# Patient Record
Sex: Female | Born: 1950 | Race: White | Hispanic: No | Marital: Married | Smoking: Never smoker
Health system: Southern US, Community
[De-identification: ages and names within clinical notes are randomized; demographics above are authoritative.]

## PROBLEM LIST (undated history)

## (undated) ENCOUNTER — Ambulatory Visit: Admission: EM | Payer: Medicare PPO

## (undated) HISTORY — PX: GALLBLADDER SURGERY: SHX652

## (undated) HISTORY — PX: COLONOSCOPY: SHX174

## (undated) HISTORY — PX: CERVICAL SPINE SURGERY: SHX589

## (undated) HISTORY — PX: CHOLECYSTECTOMY: SHX55

---

## 1998-03-22 ENCOUNTER — Other Ambulatory Visit: Admission: RE | Admit: 1998-03-22 | Discharge: 1998-03-22 | Payer: Self-pay | Admitting: Obstetrics & Gynecology

## 1999-05-07 ENCOUNTER — Other Ambulatory Visit: Admission: RE | Admit: 1999-05-07 | Discharge: 1999-05-07 | Payer: Self-pay | Admitting: Obstetrics & Gynecology

## 2000-08-25 ENCOUNTER — Other Ambulatory Visit: Admission: RE | Admit: 2000-08-25 | Discharge: 2000-08-25 | Payer: Self-pay | Admitting: Obstetrics & Gynecology

## 2002-02-23 ENCOUNTER — Other Ambulatory Visit: Admission: RE | Admit: 2002-02-23 | Discharge: 2002-02-23 | Payer: Self-pay | Admitting: Obstetrics & Gynecology

## 2003-12-15 ENCOUNTER — Emergency Department (HOSPITAL_COMMUNITY): Admission: EM | Admit: 2003-12-15 | Discharge: 2003-12-15 | Payer: Self-pay | Admitting: Emergency Medicine

## 2005-02-17 ENCOUNTER — Other Ambulatory Visit: Admission: RE | Admit: 2005-02-17 | Discharge: 2005-02-17 | Payer: Self-pay | Admitting: Obstetrics & Gynecology

## 2009-04-19 ENCOUNTER — Ambulatory Visit (HOSPITAL_COMMUNITY): Admission: RE | Admit: 2009-04-19 | Discharge: 2009-04-20 | Payer: Self-pay | Admitting: Neurological Surgery

## 2009-05-22 ENCOUNTER — Encounter: Admission: RE | Admit: 2009-05-22 | Discharge: 2009-05-22 | Payer: Self-pay | Admitting: Neurological Surgery

## 2009-07-16 ENCOUNTER — Encounter: Admission: RE | Admit: 2009-07-16 | Discharge: 2009-07-16 | Payer: Self-pay | Admitting: Neurological Surgery

## 2009-10-15 ENCOUNTER — Encounter: Admission: RE | Admit: 2009-10-15 | Discharge: 2009-10-15 | Payer: Self-pay | Admitting: Neurological Surgery

## 2010-06-02 IMAGING — CR DG CERVICAL SPINE 1V
1 series · 1 of 1 positions shown · non-contrast
Comparison: 04/19/2009

CLINICAL DATA: Postop pain.

CERVICAL SPINE - 1 VIEW

[view not recorded]
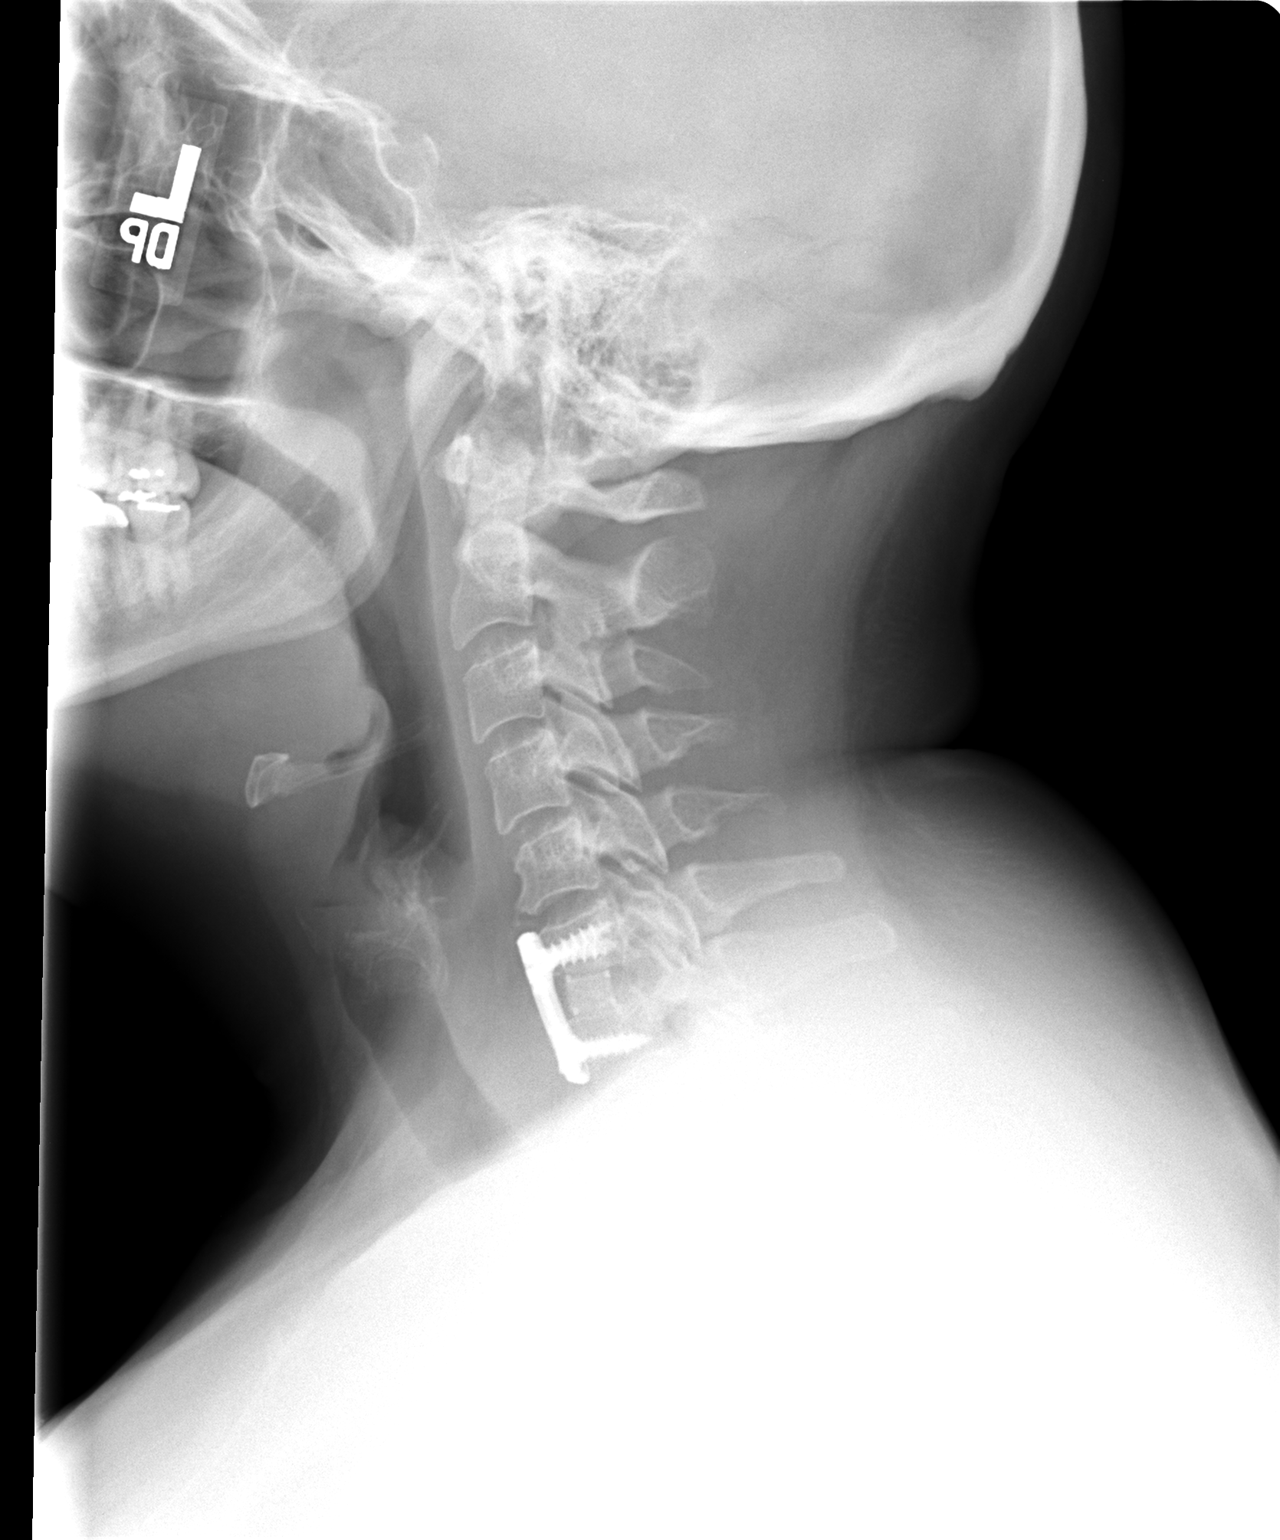

[1 of 1 positions shown; findings below may reference images not displayed]

FINDINGS: Changes of ACDF noted as C6-7.  Mild degenerative changes
as C5-6.  No prevertebral soft tissue swelling.  Normal alignment.
No complicating feature.
IMPRESSION: ACDF C6-7 without complicating feature.

## 2010-06-17 LAB — BASIC METABOLIC PANEL
BUN: 12 mg/dL (ref 6–23)
CO2: 29 mEq/L (ref 19–32)
Calcium: 9.6 mg/dL (ref 8.4–10.5)
Creatinine, Ser: 0.7 mg/dL (ref 0.4–1.2)
GFR calc non Af Amer: >60 mL/min (ref >60)
Potassium: 3.7 mEq/L (ref 3.5–5.1)

## 2010-06-17 LAB — DIFFERENTIAL
Basophils Absolute: 0 10*3/uL (ref 0.0–0.1)
Basophils Relative: 1 % (ref 0–1)
Eosinophils Absolute: 0.1 10*3/uL (ref 0.0–0.7)
Eosinophils Relative: 2 % (ref 0–5)
Lymphocytes Relative: 29 % (ref 12–46)
Lymphs Abs: 2.3 10*3/uL (ref 0.7–4.0)
Monocytes Relative: 8 % (ref 3–12)

## 2010-06-17 LAB — CBC
Hemoglobin: 14.3 g/dL (ref 12.0–15.0)
MCHC: 34.4 g/dL (ref 30.0–36.0)

## 2010-06-17 LAB — PROTIME-INR: Prothrombin Time: 12.8 seconds (ref 11.6–15.2)

## 2011-12-30 ENCOUNTER — Other Ambulatory Visit: Payer: Self-pay | Admitting: Obstetrics & Gynecology

## 2011-12-30 DIAGNOSIS — R928 Other abnormal and inconclusive findings on diagnostic imaging of breast: Secondary | ICD-10-CM

## 2012-01-01 ENCOUNTER — Other Ambulatory Visit: Payer: Self-pay | Admitting: Obstetrics & Gynecology

## 2012-01-01 ENCOUNTER — Ambulatory Visit
Admission: RE | Admit: 2012-01-01 | Discharge: 2012-01-01 | Disposition: A | Discharge status: Home / Self Care | Payer: 59 | Source: Ambulatory Visit | Attending: Obstetrics & Gynecology | Admitting: Obstetrics & Gynecology

## 2012-01-01 DIAGNOSIS — R928 Other abnormal and inconclusive findings on diagnostic imaging of breast: Secondary | ICD-10-CM

## 2012-01-01 DIAGNOSIS — N6310 Unspecified lump in the right breast, unspecified quadrant: Secondary | ICD-10-CM

## 2012-01-07 ENCOUNTER — Ambulatory Visit
Admission: RE | Admit: 2012-01-07 | Discharge: 2012-01-07 | Disposition: A | Discharge status: Home / Self Care | Payer: 59 | Source: Ambulatory Visit | Attending: Obstetrics & Gynecology | Admitting: Obstetrics & Gynecology

## 2012-01-07 ENCOUNTER — Other Ambulatory Visit: Payer: Self-pay | Admitting: Obstetrics & Gynecology

## 2012-01-07 DIAGNOSIS — N6310 Unspecified lump in the right breast, unspecified quadrant: Secondary | ICD-10-CM

## 2012-01-08 ENCOUNTER — Ambulatory Visit
Admission: RE | Admit: 2012-01-08 | Discharge: 2012-01-08 | Disposition: A | Discharge status: Home / Self Care | Payer: 59 | Source: Ambulatory Visit | Attending: Obstetrics & Gynecology | Admitting: Obstetrics & Gynecology

## 2012-01-08 DIAGNOSIS — N6310 Unspecified lump in the right breast, unspecified quadrant: Secondary | ICD-10-CM

## 2012-12-30 ENCOUNTER — Other Ambulatory Visit: Payer: Self-pay

## 2013-12-13 ENCOUNTER — Other Ambulatory Visit: Payer: Self-pay

## 2013-12-13 DIAGNOSIS — Z1231 Encounter for screening mammogram for malignant neoplasm of breast: Secondary | ICD-10-CM

## 2014-01-09 ENCOUNTER — Ambulatory Visit
Admission: RE | Admit: 2014-01-09 | Discharge: 2014-01-09 | Disposition: A | Discharge status: Home / Self Care | Payer: BC Managed Care – PPO | Source: Ambulatory Visit

## 2014-01-09 DIAGNOSIS — Z1231 Encounter for screening mammogram for malignant neoplasm of breast: Secondary | ICD-10-CM

## 2014-01-19 ENCOUNTER — Ambulatory Visit (INDEPENDENT_AMBULATORY_CARE_PROVIDER_SITE_OTHER): Payer: BC Managed Care – PPO | Admitting: Podiatry

## 2014-01-19 ENCOUNTER — Ambulatory Visit (INDEPENDENT_AMBULATORY_CARE_PROVIDER_SITE_OTHER): Payer: BC Managed Care – PPO

## 2014-01-19 ENCOUNTER — Encounter: Payer: Self-pay | Admitting: Podiatry

## 2014-01-19 VITALS — BP 134/77 | HR 75 | Resp 16 | Ht 61.0 in | Wt 183.0 lb

## 2014-01-19 DIAGNOSIS — M722 Plantar fascial fibromatosis: Secondary | ICD-10-CM

## 2014-01-19 MED ORDER — MELOXICAM 15 MG PO TABS: 15.0000 mg | ORAL_TABLET | Freq: Every day | ORAL | Status: DC

## 2014-01-19 MED ORDER — METHYLPREDNISOLONE (PAK) 4 MG PO TABS: ORAL_TABLET | ORAL | Status: DC

## 2014-01-19 NOTE — Progress Notes (Signed)
   Subjective:    Patient ID: Kathy [name], female    DOB: December 04, 1950, 63 y.o.   MRN: [id number]  HPI Comments: Both feet having pain in the arches, the left started first but that has got better, the right arch has got worse , it hurts when walking and if mash on it feels like a deep tissue bruise  Foot Pain      Review of Systems  All other systems reviewed and are negative.      Objective:   Physical Exam: I have reviewed her past medical history medications allergies surgeries social history and family history. Pulses are strongly palpable bilateral. Neurologic sensorium is intact per Semmes-Weinstein monofilament. Deep tendon reflexes are intact bilateral muscle strength is 5 over 5 dorsiflexors plantar flexors inverters everters all intrinsic musculature is intact. Orthopedic evaluation demonstrates all joints distal to the ankle a full range of motion without crepitus that she has pain on palpation of the medial calcaneal tubercles bilateral right greater than left. Radiographic evaluation demonstrates plantar distally oriented calcaneal heel spurs well soft tissue increase in density at the plantar fascial calcaneal insertion sites bilateral. Cutaneous evaluation demonstrates supple well hydrated cutis no erythema edema cellulitis drainage or odor.        Assessment & Plan:  Assessment: Plantar fasciitis bilateral.  Plan: Discussed etiology pathology conservative versus surgical therapies. Started her on a Medrol Dosepak to be followed by meloxicam. Injected the bilateral heels today with Kenalog and local anesthetic. She was dispensed plantar fascial brace is as well as a night splint. We discussed appropriate shoe gear stretching exercises ice therapy shoe gear modifications. I will followup with her in one month we discussed appropriate shoe gear stretching exercises ice therapy shoe gear modifications.

## 2014-02-16 ENCOUNTER — Ambulatory Visit (INDEPENDENT_AMBULATORY_CARE_PROVIDER_SITE_OTHER): Payer: BC Managed Care – PPO | Admitting: Podiatry

## 2014-02-16 VITALS — BP 150/67 | HR 72 | Resp 16

## 2014-02-16 DIAGNOSIS — M722 Plantar fascial fibromatosis: Secondary | ICD-10-CM

## 2014-02-16 NOTE — Progress Notes (Signed)
She presents today for follow-up of bilateral plantar fasciitis. She states that finger doing okay and having been on them a lot.  Objective: Vital signs are stable she is alert and oriented 3. Pulses are strongly palpable bilateral. She has pain on palpation medial calcaneal tubercles bilateral.  Assessment: Plantar fasciitis bilateral.  Plan: Reinjected her bilateral heels today with Kenalog and local anesthetic. She will continue all conservative therapies will follow up with her in 1 month for possible set of orthotics if needed.

## 2014-03-16 ENCOUNTER — Encounter: Payer: Self-pay | Admitting: Podiatry

## 2014-03-16 ENCOUNTER — Ambulatory Visit (INDEPENDENT_AMBULATORY_CARE_PROVIDER_SITE_OTHER): Payer: BC Managed Care – PPO | Admitting: Podiatry

## 2014-03-16 VITALS — BP 130/70 | HR 77 | Resp 16

## 2014-03-16 DIAGNOSIS — M722 Plantar fascial fibromatosis: Secondary | ICD-10-CM

## 2014-03-16 NOTE — Progress Notes (Signed)
She presents today for follow-up of plantar fasciitis. Ace that it comes and goes but it is not bad as it was.  Objective: Vital signs are stable she is alert and oriented 3 pulses are palpable bilateral. No Pain. She has pain on palpation medial calcaneal tubercles bilateral.  Assessment: Plantar fasciitis bilateral resolving.  Plan: She was candidate for set of orthotics.

## 2014-04-05 ENCOUNTER — Ambulatory Visit: Payer: BLUE CROSS/BLUE SHIELD | Admitting: *Deleted

## 2014-04-05 DIAGNOSIS — M722 Plantar fascial fibromatosis: Secondary | ICD-10-CM

## 2014-04-05 NOTE — Patient Instructions (Signed)

## 2014-04-05 NOTE — Progress Notes (Signed)
PATIENT PRESENTS FOR ORTHOTIC PICK UP  

## 2014-05-05 ENCOUNTER — Encounter: Payer: Self-pay | Admitting: Podiatry

## 2017-11-29 DIAGNOSIS — F419 Anxiety disorder, unspecified: Secondary | ICD-10-CM | POA: Insufficient documentation

## 2018-01-15 ENCOUNTER — Other Ambulatory Visit: Payer: Self-pay | Admitting: Family Medicine

## 2018-01-15 DIAGNOSIS — Z1231 Encounter for screening mammogram for malignant neoplasm of breast: Secondary | ICD-10-CM

## 2018-01-15 DIAGNOSIS — M8580 Other specified disorders of bone density and structure, unspecified site: Secondary | ICD-10-CM

## 2018-02-18 ENCOUNTER — Ambulatory Visit
Admission: RE | Admit: 2018-02-18 | Discharge: 2018-02-18 | Disposition: A | Discharge status: Home / Self Care | Payer: Medicare Other | Source: Ambulatory Visit | Attending: Family Medicine | Admitting: Family Medicine

## 2018-02-18 DIAGNOSIS — Z1231 Encounter for screening mammogram for malignant neoplasm of breast: Secondary | ICD-10-CM

## 2018-03-09 ENCOUNTER — Ambulatory Visit
Admission: RE | Admit: 2018-03-09 | Discharge: 2018-03-09 | Disposition: A | Discharge status: Home / Self Care | Payer: Medicare Other | Source: Ambulatory Visit | Attending: Family Medicine | Admitting: Family Medicine

## 2018-03-09 DIAGNOSIS — M8580 Other specified disorders of bone density and structure, unspecified site: Secondary | ICD-10-CM

## 2018-03-23 ENCOUNTER — Other Ambulatory Visit: Payer: Self-pay

## 2019-02-01 ENCOUNTER — Other Ambulatory Visit: Payer: Self-pay | Admitting: Family Medicine

## 2019-02-01 DIAGNOSIS — Z1231 Encounter for screening mammogram for malignant neoplasm of breast: Secondary | ICD-10-CM

## 2019-03-23 ENCOUNTER — Ambulatory Visit
Admission: RE | Admit: 2019-03-23 | Discharge: 2019-03-23 | Disposition: A | Discharge status: Home / Self Care | Payer: Medicare Other | Source: Ambulatory Visit | Attending: Family Medicine | Admitting: Family Medicine

## 2019-03-23 ENCOUNTER — Other Ambulatory Visit: Payer: Self-pay

## 2019-03-23 DIAGNOSIS — Z1231 Encounter for screening mammogram for malignant neoplasm of breast: Secondary | ICD-10-CM

## 2019-04-28 ENCOUNTER — Ambulatory Visit: Payer: Self-pay

## 2019-05-06 ENCOUNTER — Ambulatory Visit: Payer: Self-pay | Attending: Internal Medicine

## 2019-05-06 DIAGNOSIS — Z23 Encounter for immunization: Secondary | ICD-10-CM | POA: Insufficient documentation

## 2019-05-06 NOTE — Progress Notes (Signed)
° °  Covid-19 Vaccination Clinic  Name:  TAWNYA PUJOL    MRN: [id number] DOB: 1950/08/11  05/06/2019  Ms. Dula was observed post Covid-19 immunization for 15 minutes without incidence. She was provided with Vaccine Information Sheet and instruction to access the V-Safe system.   Ms. Hammer was instructed to call 911 with any severe reactions post vaccine:  Difficulty breathing   Swelling of your face and throat   A fast heartbeat   A bad rash all over your body   Dizziness and weakness    Immunizations Administered    Name Date Dose VIS Date Route   Pfizer COVID-19 Vaccine 05/06/2019 10:20 AM 0.3 mL 03/11/2019 Intramuscular   Manufacturer: [employer], [employer]   Lot: [id number]   NDC: [address]-1

## 2019-05-09 ENCOUNTER — Ambulatory Visit: Payer: Self-pay

## 2019-05-31 ENCOUNTER — Ambulatory Visit: Payer: Self-pay | Attending: Internal Medicine

## 2019-05-31 DIAGNOSIS — Z23 Encounter for immunization: Secondary | ICD-10-CM | POA: Insufficient documentation

## 2019-05-31 NOTE — Progress Notes (Signed)
   Covid-19 Vaccination Clinic  Name:  Kathy [name]    MRN: [id number] DOB: January 24, 1951  05/31/2019  [name] was observed post Covid-19 immunization for 15 minutes without incident. She was provided with Vaccine Information Sheet and instruction to access the V-Safe system.   [name] was instructed to call 911 with any severe reactions post vaccine: Marland Kitchen Difficulty breathing  . Swelling of face and throat  . A fast heartbeat  . A bad rash all over body  . Dizziness and weakness   Immunizations Administered    Name Date Dose VIS Date Route   Pfizer COVID-19 Vaccine 05/31/2019 11:05 AM 0.3 mL 03/11/2019 Intramuscular   Manufacturer: [employer], [employer]   Lot: [id number]   NDC: [address]-0

## 2020-02-29 ENCOUNTER — Other Ambulatory Visit: Payer: Self-pay | Admitting: Family Medicine

## 2020-02-29 DIAGNOSIS — Z1231 Encounter for screening mammogram for malignant neoplasm of breast: Secondary | ICD-10-CM

## 2020-04-13 ENCOUNTER — Ambulatory Visit: Payer: Self-pay

## 2020-04-19 ENCOUNTER — Other Ambulatory Visit: Payer: Self-pay

## 2020-04-19 ENCOUNTER — Ambulatory Visit
Admission: RE | Admit: 2020-04-19 | Discharge: 2020-04-19 | Disposition: A | Discharge status: Home / Self Care | Payer: Medicare PPO | Source: Ambulatory Visit | Attending: Family Medicine | Admitting: Family Medicine

## 2020-04-19 DIAGNOSIS — Z1231 Encounter for screening mammogram for malignant neoplasm of breast: Secondary | ICD-10-CM | POA: Diagnosis not present

## 2020-08-09 DIAGNOSIS — H5213 Myopia, bilateral: Secondary | ICD-10-CM | POA: Diagnosis not present

## 2020-10-10 DIAGNOSIS — Z0000 Encounter for general adult medical examination without abnormal findings: Secondary | ICD-10-CM | POA: Diagnosis not present

## 2020-10-10 DIAGNOSIS — Z1389 Encounter for screening for other disorder: Secondary | ICD-10-CM | POA: Diagnosis not present

## 2020-11-21 DIAGNOSIS — R7303 Prediabetes: Secondary | ICD-10-CM | POA: Diagnosis not present

## 2020-11-21 DIAGNOSIS — Z0000 Encounter for general adult medical examination without abnormal findings: Secondary | ICD-10-CM | POA: Diagnosis not present

## 2020-11-21 DIAGNOSIS — Z23 Encounter for immunization: Secondary | ICD-10-CM | POA: Diagnosis not present

## 2020-11-21 DIAGNOSIS — Z136 Encounter for screening for cardiovascular disorders: Secondary | ICD-10-CM | POA: Diagnosis not present

## 2020-11-21 DIAGNOSIS — G4700 Insomnia, unspecified: Secondary | ICD-10-CM | POA: Diagnosis not present

## 2020-11-21 DIAGNOSIS — F411 Generalized anxiety disorder: Secondary | ICD-10-CM | POA: Diagnosis not present

## 2020-11-21 DIAGNOSIS — Z13220 Encounter for screening for lipoid disorders: Secondary | ICD-10-CM | POA: Diagnosis not present

## 2020-12-21 DIAGNOSIS — D225 Melanocytic nevi of trunk: Secondary | ICD-10-CM | POA: Diagnosis not present

## 2020-12-21 DIAGNOSIS — Z1283 Encounter for screening for malignant neoplasm of skin: Secondary | ICD-10-CM | POA: Diagnosis not present

## 2021-03-16 ENCOUNTER — Emergency Department (HOSPITAL_BASED_OUTPATIENT_CLINIC_OR_DEPARTMENT_OTHER)
Admission: EM | Admit: 2021-03-16 | Discharge: 2021-03-16 | Disposition: A | Discharge status: Home / Self Care | Payer: Medicare PPO | Attending: Emergency Medicine | Admitting: Emergency Medicine

## 2021-03-16 ENCOUNTER — Encounter (HOSPITAL_BASED_OUTPATIENT_CLINIC_OR_DEPARTMENT_OTHER): Payer: Self-pay | Admitting: Emergency Medicine

## 2021-03-16 ENCOUNTER — Other Ambulatory Visit: Payer: Self-pay

## 2021-03-16 DIAGNOSIS — Z7982 Long term (current) use of aspirin: Secondary | ICD-10-CM | POA: Insufficient documentation

## 2021-03-16 DIAGNOSIS — R002 Palpitations: Secondary | ICD-10-CM | POA: Insufficient documentation

## 2021-03-16 LAB — CBC WITH DIFFERENTIAL/PLATELET
Abs Immature Granulocytes: 0.01 10*3/uL (ref 0.00–0.07)
Basophils Absolute: 0.1 10*3/uL (ref 0.0–0.1)
Basophils Relative: 1 %
Eosinophils Absolute: 0.2 10*3/uL (ref 0.0–0.5)
Eosinophils Relative: 3 %
HCT: 43.9 % (ref 36.0–46.0)
Hemoglobin: 14.7 g/dL (ref 12.0–15.0)
Immature Granulocytes: 0 %
Lymphocytes Relative: 34 %
Lymphs Abs: 2.5 10*3/uL (ref 0.7–4.0)
MCH: 31.1 pg (ref 26.0–34.0)
MCHC: 33.5 g/dL (ref 30.0–36.0)
MCV: 93 fL (ref 80.0–100.0)
Monocytes Absolute: 0.7 10*3/uL (ref 0.1–1.0)
Monocytes Relative: 9 %
Neutro Abs: 3.8 10*3/uL (ref 1.7–7.7)
Neutrophils Relative %: 53 %
Platelets: 222 10*3/uL (ref 150–400)
RBC: 4.72 MIL/uL (ref 3.87–5.11)
RDW: 12.2 % (ref 11.5–15.5)
WBC: 7.2 10*3/uL (ref 4.0–10.5)
nRBC: 0 % (ref 0.0–0.2)

## 2021-03-16 LAB — BASIC METABOLIC PANEL
Anion gap: 6 (ref 5–15)
BUN: 16 mg/dL (ref 8–23)
CO2: 28 mmol/L (ref 22–32)
Calcium: 9 mg/dL (ref 8.9–10.3)
Chloride: 108 mmol/L (ref 98–111)
Creatinine, Ser: 0.68 mg/dL (ref 0.44–1.00)
GFR, Estimated: >60 mL/min (ref >60)
Glucose, Bld: 119 mg/dL — ABNORMAL HIGH (ref 70–99)
Potassium: 3.5 mmol/L (ref 3.5–5.1)
Sodium: 142 mmol/L (ref 135–145)

## 2021-03-16 LAB — MAGNESIUM: Magnesium: 2.1 mg/dL (ref 1.7–2.4)

## 2021-03-16 NOTE — ED Triage Notes (Signed)
Pt arrives pov, ambulatory to triage with concern for A-fib. Pt denies hx in self, reports "mother has afib and I know what its like". Pt denies CP, endorses mild shob.

## 2021-03-16 NOTE — ED Provider Notes (Signed)
[place] EMERGENCY DEPARTMENT Provider Note   CSN: [id number] Arrival date & time: 03/16/21  1551     History Chief Complaint  Patient presents with   Irregular Heart Beat    Kathy [name] is a 70 y.o. female.  70 yo F with a chief complaints of intermittent palpitations.  This been going on since she is gone through menopause.  She had an episode that lasted longer today than normal.  Estimated about 45 minutes and resolved just prior to arrival here.  She denies any chest pain cough fever nausea vomiting diarrhea denies any recent medication changes.  Denies any recent change in her diet.  Denies any increased alcohol use denies any increased caffeine use.  Occurred while she was at rest on the couch.  The history is provided by the patient.  Illness Severity:  Moderate Onset quality:  Gradual Duration:  45 minutes Timing:  Sporadic Progression:  Resolved Chronicity:  New Associated symptoms: no chest pain, no congestion, no fever, no headaches, no myalgias, no nausea, no rhinorrhea, no shortness of breath, no vomiting and no wheezing       History reviewed. No pertinent past medical history.  There are no problems to display for this patient.   Past Surgical History:  Procedure Laterality Date   CERVICAL SPINE SURGERY     CHOLECYSTECTOMY     COLONOSCOPY     GALLBLADDER SURGERY       OB History   No obstetric history on file.     History reviewed. No pertinent family history.  Social History   Tobacco Use   Smoking status: Never   Smokeless tobacco: Never  Substance Use Topics   Alcohol use: Not Currently   Drug use: Never    Home Medications Prior to Admission medications   Medication Sig Start Date End Date Taking? Authorizing Provider  aspirin 81 MG tablet Take 81 mg by mouth daily.    [provider]  calcium carbonate (OS-CAL) 600 MG TABS tablet Take 600 mg by mouth 2 (two) times daily with a meal.    [provider]  Cholecalciferol (D3 HIGH POTENCY) 1000 UNITS capsule Take 1,000 Units by mouth daily.    [provider]  meloxicam (MOBIC) 15 MG tablet Take 1 tablet (15 mg total) by mouth daily. 01/19/14   Hyatt, Max T, DPM  methylPREDNIsolone (MEDROL DOSPACK) 4 MG tablet follow package directions 01/19/14   Hyatt, Max T, DPM  Misc Natural Products (ESTROVEN ENERGY PO) Take by mouth.    [provider]  vitamin B-12 (CYANOCOBALAMIN) 1000 MCG tablet Take 1,000 mcg by mouth daily.    [provider]  zolpidem (AMBIEN) 10 MG tablet Take 10 mg by mouth at bedtime as needed for sleep.    [provider]    Allergies    Hydrochlorothiazide  Review of Systems   Review of Systems  Constitutional:  Negative for chills and fever.  HENT:  Negative for congestion and rhinorrhea.   Eyes:  Negative for redness and visual disturbance.  Respiratory:  Negative for shortness of breath and wheezing.   Cardiovascular:  Positive for palpitations. Negative for chest pain.  Gastrointestinal:  Negative for nausea and vomiting.  Genitourinary:  Negative for dysuria and urgency.  Musculoskeletal:  Negative for arthralgias and myalgias.  Skin:  Negative for pallor and wound.  Neurological:  Negative for dizziness and headaches.   Physical Exam Updated Vital Signs BP 135/61    Pulse 87  Temp 98.1 F (36.7 C) (Oral)    Resp 18    Ht 5\' 1"  (1.549 m)    Wt 83 kg    SpO2 100%    BMI 34.58 kg/m   Physical Exam Vitals and nursing note reviewed.  Constitutional:      General: She is not in acute distress.    Appearance: She is well-developed. She is not diaphoretic.     Comments: BMI 34  HENT:     Head: Normocephalic and atraumatic.  Eyes:     Pupils: Pupils are equal, round, and reactive to light.  Cardiovascular:     Rate and Rhythm: Normal rate and regular rhythm.     Heart sounds: No murmur heard.   No friction rub. No gallop.  Pulmonary:     Effort: Pulmonary effort is  normal.     Breath sounds: No wheezing or rales.  Abdominal:     General: There is no distension.     Palpations: Abdomen is soft.     Tenderness: There is no abdominal tenderness.  Musculoskeletal:        General: No tenderness.     Cervical back: Normal range of motion and neck supple.  Skin:    General: Skin is warm and dry.  Neurological:     Mental Status: She is alert and oriented to person, place, and time.  Psychiatric:        Behavior: Behavior normal.    ED Results / Procedures / Treatments   Labs (all labs ordered are listed, but only abnormal results are displayed) Labs Reviewed  BASIC METABOLIC PANEL - Abnormal; Notable for the following components:      Result Value   Glucose, Bld 119 (*)    All other components within normal limits  CBC WITH DIFFERENTIAL/PLATELET  MAGNESIUM    EKG EKG Interpretation  Date/Time:  Saturday March 16 2021 15:59:26 EST Ventricular Rate:  82 PR Interval:  153 QRS Duration: 85 QT Interval:  376 QTC Calculation: 440 R Axis:   22 Text Interpretation: Sinus rhythm Borderline T abnormalities, anterior leads No significant change since last tracing Confirmed by [id number] [phone]) on 03/16/2021 4:17:36 PM  Radiology No results found.  Procedures Procedures   Medications Ordered in ED Medications - No data to display  ED Course  I have reviewed the triage vital signs and the nursing notes.  Pertinent labs & imaging results that were available during my care of the patient were reviewed by me and considered in my medical decision making (see chart for details).    MDM Rules/Calculators/A&P                         70 yo F with a chief complaint of palpitations.  She has had these for many years but felt like they were much more persistent today.  Mom has a history of A. fib and she was wondering if she had the same.  Resolved prior to arrival here.  Normal sinus rhythm.  Will check electrolytes.  PCP  follow-up.   Potassium magnesium unremarkable.  No significant change to renal function no acute anemia.  Will discharge home.  Of note the patient was significantly hypertensive upon arrival and resolved without any intervention.  5:48 PM:  I have discussed the diagnosis/risks/treatment options with the patient and believe the pt to be eligible for discharge home to follow-up with PCP. We also discussed returning to the ED immediately if  new or worsening sx occur. We discussed the sx which are most concerning (e.g., sudden worsening pain, fever, inability to tolerate by mouth) that necessitate immediate return. Medications administered to the patient during their visit and any new prescriptions provided to the patient are listed below.  Medications given during this visit Medications - No data to display   The patient appears reasonably screen and/or stabilized for discharge and I doubt any other medical condition or other [facility] requiring further screening, evaluation, or treatment in the ED at this time prior to discharge.       Final Clinical Impression(s) / ED Diagnoses Final diagnoses:  Palpitations    Rx / DC Orders ED Discharge Orders     None        Deno Etienne, DO 03/16/21 1748

## 2021-03-16 NOTE — Discharge Instructions (Signed)
Please return for persistent and unresolving symptoms or if you develop chest pain or difficulty breathing.  Follow-up with your family doctor in the office.

## 2021-03-16 NOTE — ED Notes (Signed)
Pt discharged to home. Discharge instructions have been discussed with patient and/or family members. Pt verbally acknowledges understanding d/c instructions, and endorses comprehension to checkout at registration before leaving.  °

## 2021-03-21 ENCOUNTER — Other Ambulatory Visit: Payer: Self-pay | Admitting: Family Medicine

## 2021-03-21 DIAGNOSIS — Z1231 Encounter for screening mammogram for malignant neoplasm of breast: Secondary | ICD-10-CM

## 2021-04-22 ENCOUNTER — Ambulatory Visit
Admission: RE | Admit: 2021-04-22 | Discharge: 2021-04-22 | Disposition: A | Discharge status: Home / Self Care | Payer: Medicare PPO | Source: Ambulatory Visit | Attending: Family Medicine | Admitting: Family Medicine

## 2021-04-22 DIAGNOSIS — Z1231 Encounter for screening mammogram for malignant neoplasm of breast: Secondary | ICD-10-CM

## 2021-06-11 ENCOUNTER — Telehealth: Payer: Self-pay | Admitting: Family Medicine

## 2021-06-11 NOTE — Telephone Encounter (Signed)
Please find out who her mother is and note the name in appointment notes. ?

## 2021-06-11 NOTE — Telephone Encounter (Signed)
Pt is asking to become a pt of Dr Durene Cal. Her mother is currently a pt. She said she spoke to [facility] yesterday about this but I do not see any notes.  ?

## 2021-06-12 NOTE — Telephone Encounter (Signed)
Called and lvm to call our office to schedule a New Patient appt. Pt's mother, Arvin Collard is already a pt. Per Durene Cal ?

## 2021-06-24 NOTE — Progress Notes (Signed)
? ?Phone: [phone] ?  ?Subjective:  ?Patient presents today to establish care.  Prior patient of Beverley FiedlerVictoria Rankins, MD [facility].  ?Chief Complaint  ?Patient presents with  ? New Patient (Initial Visit)  ? Palpitations  ?  Pt c/o palpitations most recent episdode was last night. Has had recent ekg in Dec.  ? ?See problem oriented charting ? ?The following were reviewed and entered/updated in epic: ?Past Medical History:  ?Diagnosis Date  ? GERD (gastroesophageal reflux disease) 07/02/2021  ? Sparing omeprazole . Was told hiatal hernia in past   ? History of heart murmur in childhood 07/02/2021  ? Hyperlipidemia, unspecified 07/02/2021  ? Not on meds   ? White coat syndrome without diagnosis of hypertension 07/02/2021  ? Diagnosed around age 71   ? ?Patient Active Problem List  ? Diagnosis Date Noted  ? Hyperlipidemia, unspecified 07/02/2021  ?  Priority: Medium   ? Anxiety 07/02/2021  ?  Priority: Medium   ? Caregiver burden 07/02/2021  ?  Priority: Low  ? White coat syndrome without diagnosis of hypertension 07/02/2021  ?  Priority: Low  ? GERD (gastroesophageal reflux disease) 07/02/2021  ?  Priority: Low  ? History of heart murmur in childhood 07/02/2021  ?  Priority: Low  ? ?Past Surgical History:  ?Procedure Laterality Date  ? CERVICAL SPINE SURGERY    ? CHOLECYSTECTOMY    ? COLONOSCOPY    ? ?Family History  ?Problem Relation Age of Onset  ? Atrial fibrillation Mother   ? Heart failure Mother   ? Hypertension Mother   ? Memory loss Mother   ? CAD Father   ?     smoker died 2561  ? Hyperlipidemia Father   ? Diabetes Father   ? ? ?Medications- reviewed and updated ?Current Outpatient Medications  ?Medication Sig Dispense Refill  ? ALPRAZolam (XANAX) 0.25 MG tablet Take 1 tablet (0.25 mg total) by mouth 2 (two) times daily as needed for anxiety (do not take for 8 hours after driving and do not take on nights you take ambien). 20 tablet 0  ? aspirin 81 MG tablet Take 81 mg by mouth daily.    ? calcium carbonate (OS-CAL)  600 MG TABS tablet Take 600 mg by mouth 2 (two) times daily with a meal.    ? Cholecalciferol 25 MCG (1000 UT) capsule Take 1,000 Units by mouth daily.    ? vitamin B-12 (CYANOCOBALAMIN) 1000 MCG tablet Take 1,000 mcg by mouth daily.    ? zolpidem (AMBIEN) 10 MG tablet Take 0.5-1 tablets (5-10 mg total) by mouth at bedtime as needed for sleep (try to limit to 5 mg most of the time). 20 tablet 5  ? ?No current facility-administered medications for this visit.  ? ? ?Allergies-reviewed and updated ?Allergies  ?Allergen Reactions  ? Hydrochlorothiazide   ?  Other reaction(s): low potassium  ? ? ?Social History  ? ?Social History Narrative  ? Married. No siblings. 2 kids. 2 grandkids 7 grandson, almost 4 daughter in 2023.   ?   ? 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   ? Hobbies: enjoys the beach, reading but harder when caring for grandkids.   ? ? ?Objective  ?Objective:  ?BP 130/70   Pulse 78   Temp 98.5 ?F (36.9 ?C)   Ht 5\' 1"  (1.549 m)   Wt 184 lb 6.4 oz (83.6 kg)   SpO2 96%   BMI  34.84 kg/m?  ?Gen: NAD, resting comfortably ?HEENT: Mucous membranes are moist. Oropharynx normal. TM normal. ?Eyes: sclera and lids normal, PERRLA ?Neck: no thyromegaly, no cervical lymphadenopathy ?CV: RRR no murmurs rubs or gallops ?Lungs: CTAB no crackles, wheeze, rhonchi ?Abdomen: soft/nontender/nondistended/normal bowel sounds. No rebound or guarding.  ?Ext: no edema ?Skin: warm, dry ?Neuro: 5/5 strength in upper and lower extremities, normal gait, normal reflexes ? ?  ?Assessment and Plan:  ? ?# ED F/U for palpitations  ?S:Pt was seen in ED on 03/16/21 for an evaluation of intermittent palpitations. Pt reported this had been going on since she started menopause. She had an episode that lasted longer than usual the day of ED visit.She was resting on the cough when episode occurred. Episode lasted about 45 minutes and resolved before arriving to ED visit. She denied any  chest pain, cough or fever. Also denied any changes in her diet or increase in alcohol or caffeine intake.  EKG at that time was sent as sinus rhythm but borderline T wave abnormalities no significant change from prior EKG in 2022.  She did have a blood pressure that was initially elevated but trended down while in the emergency department.  Outpatient PCP follow-up was recommended ? ?Family hx of Afib (mom).  This was patient's main concern.  Electrolytes were reassuring with add-ons of magnesium.  CBC was normal. ? ?Today she reports,has another prolonged episode for 40 minutes. These are the only 2 long episodes.   ?A/P: 71 year old female with history of intermittent palpitations since beginning menopause but with 2 prolonged episodes in the last 5 months lasting over 30 minutes one that was 45 minutes and 1 it was about 40 minutes.  The first when she was evaluated in the emergency room with reassuring work-up.  The second 1 happened recently and resolved on its own.  We discussed certainly could do a cardiac monitor but with how infrequent the prolonged episodes are we may miss an underlying rhythm-she may consider getting something like an Apple Watch or if episodes become more frequent we can certainly try to do a prolonged monitor that she will reach out ?- Did discuss checking a TSH just in case that is affecting her symptoms- she opts in ? ?#hyperlipidemia ?S: Medication: aspirin 81 mg, no statin ?A/P: we are trying to locate her records- for now continue current meds and stay off statin ? ?# insomnia ?S:medication: ambien 10 mg- 1/2 to 1/3 of a tablet. Gets 10 or 20 at time per insurance. Also does melatonin gummies   ?A/P: reasonable control with as needed ambien but does not have enough to take regular due to insurance restrictions. Will try to send in #20 but encouraged her to take less ? ?# anxiety ?S:fair amount of stress as caregiver. Has taken alprazolam about once a week lately- when things are  more settled at home maybe once a month. On 0.25mg  dose  ?A/P: reviewed PDMP and very sparing use- I refilled tday- has been reasonably controlled with meds ?-doesn't drive with this or ambien in system and doesn't take on same days ? ?# Hyperglycemia/insulin resistance/prediabetes- max a1c 5.7 on 10/05/19 with [facility] ?S:  Medication: none ?Exercise and diet- walking 30 mins 3-4 times a week, weight stable ?- improved last visit down to 5.6 ? A/P: glad this improved on last check- doing weight watchers and walking- Encouraged need for healthy eating, regular exercise, weight loss.  ? ?#HM- Dr. Bosie Clos with [facility]- she will call to see if  she is due for repeat ?-ger records of labs and immunizations ? ? Recommended follow up: No follow-ups on file. ? ?Meds ordered this encounter  ?Medications  ? zolpidem (AMBIEN) 10 MG tablet  ?  Sig: Take 0.5-1 tablets (5-10 mg total) by mouth at bedtime as needed for sleep (try to limit to 5 mg most of the time).  ?  Dispense:  20 tablet  ?  Refill:  5  ? ALPRAZolam (XANAX) 0.25 MG tablet  ?  Sig: Take 1 tablet (0.25 mg total) by mouth 2 (two) times daily as needed for anxiety (do not take for 8 hours after driving and do not take on nights you take ambien).  ?  Dispense:  20 tablet  ?  Refill:  0  ? ? ?Return precautions advised. ?Tana Conch, MD ? ?

## 2021-07-02 ENCOUNTER — Ambulatory Visit: Payer: Medicare PPO | Admitting: Family Medicine

## 2021-07-02 ENCOUNTER — Encounter: Payer: Self-pay | Admitting: Family Medicine

## 2021-07-02 VITALS — BP 130/70 | HR 78 | Temp 98.5°F | Ht 61.0 in | Wt 184.4 lb

## 2021-07-02 DIAGNOSIS — R002 Palpitations: Secondary | ICD-10-CM | POA: Diagnosis not present

## 2021-07-02 DIAGNOSIS — F419 Anxiety disorder, unspecified: Secondary | ICD-10-CM

## 2021-07-02 DIAGNOSIS — E785 Hyperlipidemia, unspecified: Secondary | ICD-10-CM | POA: Diagnosis not present

## 2021-07-02 DIAGNOSIS — K219 Gastro-esophageal reflux disease without esophagitis: Secondary | ICD-10-CM | POA: Diagnosis not present

## 2021-07-02 DIAGNOSIS — R030 Elevated blood-pressure reading, without diagnosis of hypertension: Secondary | ICD-10-CM

## 2021-07-02 DIAGNOSIS — Z1159 Encounter for screening for other viral diseases: Secondary | ICD-10-CM | POA: Diagnosis not present

## 2021-07-02 DIAGNOSIS — Z8679 Personal history of other diseases of the circulatory system: Secondary | ICD-10-CM | POA: Diagnosis not present

## 2021-07-02 DIAGNOSIS — Z636 Dependent relative needing care at home: Secondary | ICD-10-CM | POA: Diagnosis not present

## 2021-07-02 HISTORY — DX: Personal history of other diseases of the circulatory system: Z86.79

## 2021-07-02 HISTORY — DX: Hyperlipidemia, unspecified: E78.5

## 2021-07-02 HISTORY — DX: Gastro-esophageal reflux disease without esophagitis: K21.9

## 2021-07-02 HISTORY — DX: Elevated blood-pressure reading, without diagnosis of hypertension: R03.0

## 2021-07-02 LAB — COMPREHENSIVE METABOLIC PANEL
ALT: 33 U/L (ref 0–35)
AST: 33 U/L (ref 0–37)
Albumin: 4.6 g/dL (ref 3.5–5.2)
Alkaline Phosphatase: 73 U/L (ref 39–117)
BUN: 20 mg/dL (ref 6–23)
CO2: 26 mEq/L (ref 19–32)
Calcium: 10 mg/dL (ref 8.4–10.5)
Chloride: 103 mEq/L (ref 96–112)
Creatinine, Ser: 0.78 mg/dL (ref 0.40–1.20)
GFR: 77.56 mL/min (ref >60.00)
Glucose, Bld: 89 mg/dL (ref 70–99)
Potassium: 3.7 mEq/L (ref 3.5–5.1)
Sodium: 138 mEq/L (ref 135–145)
Total Bilirubin: 0.7 mg/dL (ref 0.2–1.2)
Total Protein: 7.7 g/dL (ref 6.0–8.3)

## 2021-07-02 LAB — TSH: TSH: 1.53 u[IU]/mL (ref 0.35–5.50)

## 2021-07-02 MED ORDER — ZOLPIDEM TARTRATE 10 MG PO TABS: 5.0000 mg | ORAL_TABLET | Freq: Every evening | ORAL | 5 refills | Status: DC | PRN

## 2021-07-02 MED ORDER — ALPRAZOLAM 0.25 MG PO TABS: 0.2500 mg | ORAL_TABLET | Freq: Two times a day (BID) | ORAL | 0 refills | Status: DC | PRN

## 2021-07-02 NOTE — Patient Instructions (Addendum)
Sign release of information at the check out desk for last colonoscopy from [place] ? ?Sign release of information at the check out desk for labs  from  [place] [facility] care last 2 years and last 2 years of visits, all immunizations please ?- I saw in your labs you had hep C screening- we want a copy of that as well ? ?Let us know when you get shingrix shot ? ?Please stop by lab before you go ?If you have mychart- we will send your results within 3 business days of [name] receiving them.  ?If you do not have mychart- we will call you about results within 5 business days of [name] receiving them.  ?*please also note that you will see labs on mychart as soon as they post. I will later go in and write notes on them- will say "notes from Dr. Yong Channel"  ? ?Recommended follow up: Return in about 6 months (around 01/01/2022) for physical or sooner if needed.Schedule b4 you leave.  ?

## 2021-09-26 ENCOUNTER — Telehealth: Payer: Self-pay | Admitting: Family Medicine

## 2021-09-26 NOTE — Telephone Encounter (Signed)
Copied from CRM [id number]. Topic: Medicare AWV >> Sep 26, 2021 10:02 AM Payton Doughty wrote: Reason for CRM: Left message for patient to schedule Annual Wellness Visit.  Please schedule with Nurse Health Advisor Lanier Ensign, RN at [facility].  Please call [phone] ask for [facility]

## 2021-10-04 NOTE — Telephone Encounter (Signed)
Patient is scheduled for AWV on 10/08/21

## 2021-10-04 NOTE — Telephone Encounter (Signed)
Noted  

## 2021-10-08 ENCOUNTER — Ambulatory Visit (INDEPENDENT_AMBULATORY_CARE_PROVIDER_SITE_OTHER): Payer: Medicare PPO

## 2021-10-08 DIAGNOSIS — Z0000 Encounter for general adult medical examination without abnormal findings: Secondary | ICD-10-CM

## 2021-10-08 DIAGNOSIS — Z638 Other specified problems related to primary support group: Secondary | ICD-10-CM

## 2021-10-08 NOTE — Progress Notes (Addendum)
Virtual Visit via Telephone Note  I connected with  Kathy [name] on 10/08/21 at  9:30 AM EDT by telephone and verified that I am speaking with the correct person using two identifiers.  Medicare Annual Wellness visit completed telephonically due to Covid-19 pandemic.   Persons participating in this call: This Health Coach and this patient.   Location: Patient: Home Provider: Office    I discussed the limitations, risks, security and privacy concerns of performing an evaluation and management service by telephone and the availability of in person appointments. The patient expressed understanding and agreed to proceed.  Unable to perform video visit due to video visit attempted and failed and/or patient does not have video capability.   Some vital signs may be absent or patient reported.   Kathy Schlein, LPN   Subjective:   Kathy [name] is a 71 y.o. female who presents for an Initial Medicare Annual Wellness Visit.  Review of Systems     Cardiac Risk Factors include: advanced age (>64men, >29 women);dyslipidemia;hypertension;obesity (BMI >30kg/m2)     Objective:    There were no vitals filed for this visit. There is no height or weight on file to calculate BMI.     10/08/2021    9:41 AM 03/16/2021    4:01 PM  Advanced Directives  Does Patient Have a Medical Advance Directive? Yes Yes  Type of Estate agent of [place];Living will   Copy of Healthcare Power of Attorney in Chart? No - copy requested     Current Medications (verified) Outpatient Encounter Medications as of 10/08/2021  Medication Sig   ALPRAZolam (XANAX) 0.25 MG tablet Take 1 tablet (0.25 mg total) by mouth 2 (two) times daily as needed for anxiety (do not take for 8 hours after driving and do not take on nights you take ambien).   aspirin 81 MG tablet Take 81 mg by mouth daily.   calcium carbonate (OS-CAL) 600 MG TABS tablet Take 600 mg by mouth 2 (two) times daily with a meal.    Cholecalciferol 25 MCG (1000 UT) capsule Take 1,000 Units by mouth daily.   Collagen-Boron-Hyaluronic Acid ([facility] JOINT HEALTH TRIPLE ACTION PO) Take by mouth.   Krill Oil 1000 MG CAPS daily.   MAGNESIUM PO magnesium   vitamin B-12 (CYANOCOBALAMIN) 1000 MCG tablet Take 1,000 mcg by mouth daily.   zolpidem (AMBIEN) 10 MG tablet Take 0.5-1 tablets (5-10 mg total) by mouth at bedtime as needed for sleep (try to limit to 5 mg most of the time).   No facility-administered encounter medications on file as of 10/08/2021.    Allergies (verified) Hydrochlorothiazide   History: Past Medical History:  Diagnosis Date   GERD (gastroesophageal reflux disease) 07/02/2021   Sparing omeprazole . Was told hiatal hernia in past    History of heart murmur in childhood 07/02/2021   Hyperlipidemia, unspecified 07/02/2021   Not on meds    White coat syndrome without diagnosis of hypertension 07/02/2021   Diagnosed around age 33    Past Surgical History:  Procedure Laterality Date   CERVICAL SPINE SURGERY     CHOLECYSTECTOMY     COLONOSCOPY     Family History  Problem Relation Age of Onset   Atrial fibrillation Mother    Heart failure Mother    Hypertension Mother    Memory loss Mother    CAD Father        smoker died 73   Hyperlipidemia Father    Diabetes Father  Social History   Socioeconomic History   Marital status: Married    Spouse name: Not on file   Number of children: Not on file   Years of education: Not on file   Highest education level: Not on file  Occupational History   Not on file  Tobacco Use   Smoking status: Never   Smokeless tobacco: Never  Vaping Use   Vaping Use: Never used  Substance and Sexual Activity   Alcohol use: Yes    Comment: twice a month  or up to once a week   Drug use: Never   Sexual activity: Not on file  Other Topics Concern   Not on file  Social History Narrative   Married. No siblings. 2 kids. 2 grandkids 7 grandson, almost 4 daughter in 2023.        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, reading but harder when caring for grandkids.    Social Determinants of Health   Financial Resource Strain: Low Risk  (10/08/2021)   Overall Financial Resource Strain (CARDIA)    Difficulty of Paying Living Expenses: Not hard at all  Food Insecurity: No Food Insecurity (10/08/2021)   Hunger Vital Sign    Worried About Running Out of Food in the Last Year: Never true    Ran Out of Food in the Last Year: Never true  Transportation Needs: No Transportation Needs (10/08/2021)   PRAPARE - Administrator, Civil Service (Medical): No    Lack of Transportation (Non-Medical): No  Physical Activity: Inactive (10/08/2021)   Exercise Vital Sign    Days of Exercise per Week: 0 days    Minutes of Exercise per Session: 0 min  Stress: Stress Concern Present (10/08/2021)   [employer] of Occupational Health - Occupational Stress Questionnaire    Feeling of Stress : To some extent  Social Connections: Moderately Integrated (10/08/2021)   Social Connection and Isolation Panel [NHANES]    Frequency of Communication with Friends and Family: More than three times a week    Frequency of Social Gatherings with Friends and Family: Three times a week    Attends Religious Services: 1 to 4 times per year    Active Member of Clubs or Organizations: No    Attends Banker Meetings: Never    Marital Status: Married    Tobacco Counseling Counseling given: Not Answered   Clinical Intake:  Pre-visit preparation completed: Yes  Pain : No/denies pain     BMI - recorded: 34.86 Nutritional Status: BMI > 30  Obese Nutritional Risks: None Diabetes: No  How often do you need to have someone help you when you read instructions, pamphlets, or other written materials from your doctor or pharmacy?: 1 - Never  Diabetic?no  Interpreter Needed?:  No  Information entered by :: Lanier Ensign, LPN   Activities of Daily Living    10/08/2021    9:42 AM  In your present state of health, do you have any difficulty performing the following activities:  Hearing? 1  Comment wears hearing aids  Vision? 0  Difficulty concentrating or making decisions? 0  Walking or climbing stairs? 0  Dressing or bathing? 0  Doing errands, shopping? 0  Preparing Food and eating ? N  Using the Toilet? N  In the past six months, have you accidently leaked urine? Y  Comment wears a pad  Do you have problems with loss of bowel control? N  Managing your Medications? N  Managing your Finances? N  Housekeeping or managing your Housekeeping? N    Patient Care Team: Shelva Majestic, MD as PCP - General (Family Medicine)  Indicate any recent Medical Services you may have received from other than [facility] providers in the past year (date may be approximate).     Assessment:   This is a routine wellness examination for Kathy [name].  Hearing/Vision screen Hearing Screening - Comments:: Pt wears hearing aids  Vision Screening - Comments:: Pt follows up Dr Fredrich Birks for annul eye exams   Dietary issues and exercise activities discussed: Current Exercise Habits: The patient does not participate in regular exercise at present   Goals Addressed   None    Depression Screen    10/08/2021    9:36 AM 07/02/2021    1:02 PM  PHQ 2/9 Scores  PHQ - 2 Score 1 1  PHQ- 9 Score 2 4    Fall Risk    10/08/2021    9:42 AM  Fall Risk   Falls in the past year? 0  Number falls in past yr: 0  Injury with Fall? 0  Risk for fall due to : Impaired vision  Follow up Falls prevention discussed    FALL RISK PREVENTION PERTAINING TO THE HOME:  Any stairs in or around the home? Yes  If so, are there any without handrails? No  Home free of loose throw rugs in walkways, pet beds, electrical cords, etc? Yes  Adequate lighting in your home to reduce risk of falls? Yes    ASSISTIVE DEVICES UTILIZED TO PREVENT FALLS:  Life alert? No  Use of a cane, walker or w/c? No  Grab bars in the bathroom? No  Shower chair or bench in shower? No  Elevated toilet seat or a handicapped toilet? No   TIMED UP AND GO:  Was the test performed? No .  Cognitive Function:        10/08/2021    9:44 AM  6CIT Screen  What Year? 0 points  What month? 0 points  What time? 0 points  Count back from 20 0 points  Months in reverse 0 points  Repeat phrase 0 points  Total Score 0 points    Immunizations Immunization History  Administered Date(s) Administered   Influenza Split 02/14/2009, 01/27/2011, 01/31/2014, 12/27/2018   PFIZER(Purple Top)SARS-COV-2 Vaccination 05/06/2019, 05/31/2019, 01/02/2020, 08/12/2020   Pfizer Covid-19 Vaccine Bivalent Booster 70yrs & up 12/21/2020   Pneumococcal Conjugate-13 07/16/2017   Pneumococcal Polysaccharide-23 10/05/2019   Tdap 01/27/2011, 11/21/2020   Zoster, Live 10/27/2012    TDAP status: Up to date  Flu Vaccine status: Up to date, per pt   Pneumococcal vaccine status: Up to date  Covid-19 vaccine status: Completed vaccines  Qualifies for Shingles Vaccine? Yes   Zostavax completed Yes   Shingrix Completed?: No.    Education has been provided regarding the importance of this vaccine. Patient has been advised to call insurance company to determine out of pocket expense if they have not yet received this vaccine. Advised may also receive vaccine at local pharmacy or Health Dept. Verbalized acceptance and understanding.  Screening Tests Health Maintenance  Topic Date Due   Hepatitis C Screening  Never done   Zoster Vaccines- Shingrix (1 of 2) Never done   COVID-19 Vaccine (6 - Pfizer series) 04/22/2021   COLONOSCOPY (Pts 45-29yrs Insurance coverage will need to be confirmed)  07/03/2022 (  Originally 03/12/1997)   INFLUENZA VACCINE  10/29/2021   MAMMOGRAM  04/23/2023   TETANUS/TDAP  11/22/2030   Pneumonia Vaccine 18+  Years old  Completed   DEXA SCAN  Completed   HPV VACCINES  Aged Out    Health Maintenance  Health Maintenance Due  Topic Date Due   Hepatitis C Screening  Never done   Zoster Vaccines- Shingrix (1 of 2) Never done   COVID-19 Vaccine (6 - Pfizer series) 04/22/2021    Colorectal cancer screening: Type of screening: Colonoscopy. Completed 12/01/12. Repeat every 10 years  Mammogram status: Completed 04/22/21. Repeat every year  Bone Density status: Completed 03/09/18. Results reflect: Bone density results: NORMAL. Repeat every 2 years.   Additional Screening:  Hepatitis C Screening: does qualify;  Vision Screening: Recommended annual ophthalmology exams for early detection of glaucoma and other disorders of the eye. Is the patient up to date with their annual eye exam?  Yes  Who is the provider or what is the name of the office in which the patient attends annual eye exams? Dr Fredrich Birks  If pt is not established with a provider, would they like to be referred to a provider to establish care? No .   Dental Screening: Recommended annual dental exams for proper oral hygiene  Community Resource Referral / Chronic Care Management: CRR required this visit?  yes  CCM required this visit?  No      Plan:     I have personally reviewed and noted the following in the patient's chart:   Medical and social history Use of alcohol, tobacco or illicit drugs  Current medications and supplements including opioid prescriptions. Patient is not currently taking opioid prescriptions. Functional ability and status Nutritional status Physical activity Advanced directives List of other physicians Hospitalizations, surgeries, and ER visits in previous 12 months Vitals Screenings to include cognitive, depression, and falls Referrals and appointments  In addition, I have reviewed and discussed with patient certain preventive protocols, quality metrics, and best practice recommendations. A  written personalized care plan for preventive services as well as general preventive health recommendations were provided to patient.     Kathy Schlein, LPN   [id number]   Nurse Notes: none

## 2021-10-08 NOTE — Patient Instructions (Addendum)
Kathy [name] , Thank you for taking time to come for your Medicare Wellness Visit. I appreciate your ongoing commitment to your health goals. Please review the following plan we discussed and let me know if I can assist you in the future.   Screening recommendations/referrals: Colonoscopy: Done 12/01/12 repeat every 10 years  Mammogram: Done 04/22/21 repeat every year  Bone Density: Done 03/09/18 repeat every 2 years  Recommended yearly ophthalmology/optometry visit for glaucoma screening and checkup Recommended yearly dental visit for hygiene and checkup  Vaccinations: Influenza vaccine: pt stated up to date  Pneumococcal vaccine: Up to date Tdap vaccine: Done 11/21/20 repeat every 10 years  Shingles vaccine: Shingrix discussed. Please contact your pharmacy for coverage information.    Covid-19:Completed 2/5, 3/2, 01/02/20 & 5/15, 12/21/21  Advanced directives: Please bring a copy of your health care power of attorney and living will to the office at your convenience.  Conditions/risks identified: lose weight   Next appointment: Follow up in one year for your annual wellness visit    Preventive Care 65 Years and Older, Female Preventive care refers to lifestyle choices and visits with your health care provider that can promote health and wellness. What does preventive care include? A yearly physical exam. This is also called an annual well check. Dental exams once or twice a year. Routine eye exams. Ask your health care provider how often you should have your eyes checked. Personal lifestyle choices, including: Daily care of your teeth and gums. Regular physical activity. Eating a healthy diet. Avoiding tobacco and drug use. Limiting alcohol use. Practicing safe sex. Taking low-dose aspirin every day. Taking vitamin and mineral supplements as recommended by your health care provider. What happens during an annual well check? The services and screenings done by your health care  provider during your annual well check will depend on your age, overall health, lifestyle risk factors, and family history of disease. Counseling  Your health care provider may ask you questions about your: Alcohol use. Tobacco use. Drug use. Emotional well-being. Home and relationship well-being. Sexual activity. Eating habits. History of falls. Memory and ability to understand (cognition). Work and work Astronomer. Reproductive health. Screening  You may have the following tests or measurements: Height, weight, and BMI. Blood pressure. Lipid and cholesterol levels. These may be checked every 5 years, or more frequently if you are over 41 years old. Skin check. Lung cancer screening. You may have this screening every year starting at age 2 if you have a 30-pack-year history of smoking and currently smoke or have quit within the past 15 years. Fecal occult blood test (FOBT) of the stool. You may have this test every year starting at age 67. Flexible sigmoidoscopy or colonoscopy. You may have a sigmoidoscopy every 5 years or a colonoscopy every 10 years starting at age 4. Hepatitis C blood test. Hepatitis B blood test. Sexually transmitted disease (STD) testing. Diabetes screening. This is done by checking your blood sugar (glucose) after you have not eaten for a while (fasting). You may have this done every 1-3 years. Bone density scan. This is done to screen for osteoporosis. You may have this done starting at age 83. Mammogram. This may be done every 1-2 years. Talk to your health care provider about how often you should have regular mammograms. Talk with your health care provider about your test results, treatment options, and if necessary, the need for more tests. Vaccines  Your health care provider may recommend certain vaccines, such as: Influenza vaccine.  This is recommended every year. Tetanus, diphtheria, and acellular pertussis (Tdap, Td) vaccine. You may need a Td  booster every 10 years. Zoster vaccine. You may need this after age 22. Pneumococcal 13-valent conjugate (PCV13) vaccine. One dose is recommended after age 49. Pneumococcal polysaccharide (PPSV23) vaccine. One dose is recommended after age 79. Talk to your health care provider about which screenings and vaccines you need and how often you need them. This information is not intended to replace advice given to you by your health care provider. Make sure you discuss any questions you have with your health care provider. Document Released: 04/13/2015 Document Revised: 12/05/2015 Document Reviewed: 01/16/2015 Elsevier Interactive Patient Education  2017 [place] Prevention in the Home Falls can cause injuries. They can happen to people of all ages. There are many things you can do to make your home safe and to help prevent falls. What can I do on the outside of my home? Regularly fix the edges of walkways and driveways and fix any cracks. Remove anything that might make you trip as you walk through a door, such as a raised step or threshold. Trim any bushes or trees on the path to your home. Use bright outdoor lighting. Clear any walking paths of anything that might make someone trip, such as rocks or tools. Regularly check to see if handrails are loose or broken. Make sure that both sides of any steps have handrails. Any raised decks and porches should have guardrails on the edges. Have any leaves, snow, or ice cleared regularly. Use sand or salt on walking paths during winter. Clean up any spills in your garage right away. This includes oil or grease spills. What can I do in the bathroom? Use night lights. Install grab bars by the toilet and in the tub and shower. Do not use towel bars as grab bars. Use non-skid mats or decals in the tub or shower. If you need to sit down in the shower, use a plastic, non-slip stool. Keep the floor dry. Clean up any water that spills on the floor  as soon as it happens. Remove soap buildup in the tub or shower regularly. Attach bath mats securely with double-sided non-slip rug tape. Do not have throw rugs and other things on the floor that can make you trip. What can I do in the bedroom? Use night lights. Make sure that you have a light by your bed that is easy to reach. Do not use any sheets or blankets that are too big for your bed. They should not hang down onto the floor. Have a firm chair that has side arms. You can use this for support while you get dressed. Do not have throw rugs and other things on the floor that can make you trip. What can I do in the kitchen? Clean up any spills right away. Avoid walking on wet floors. Keep items that you use a lot in easy-to-reach places. If you need to reach something above you, use a strong step stool that has a grab bar. Keep electrical cords out of the way. Do not use floor polish or wax that makes floors slippery. If you must use wax, use non-skid floor wax. Do not have throw rugs and other things on the floor that can make you trip. What can I do with my stairs? Do not leave any items on the stairs. Make sure that there are handrails on both sides of the stairs and use them. Fix handrails  that are broken or loose. Make sure that handrails are as long as the stairways. Check any carpeting to make sure that it is firmly attached to the stairs. Fix any carpet that is loose or worn. Avoid having throw rugs at the top or bottom of the stairs. If you do have throw rugs, attach them to the floor with carpet tape. Make sure that you have a light switch at the top of the stairs and the bottom of the stairs. If you do not have them, ask someone to add them for you. What else can I do to help prevent falls? Wear shoes that: Do not have high heels. Have rubber bottoms. Are comfortable and fit you well. Are closed at the toe. Do not wear sandals. If you use a stepladder: Make sure that it is  fully opened. Do not climb a closed stepladder. Make sure that both sides of the stepladder are locked into place. Ask someone to hold it for you, if possible. Clearly [place] and make sure that you can see: Any grab bars or handrails. First and last steps. Where the edge of each step is. Use tools that help you move around (mobility aids) if they are needed. These include: Canes. Walkers. Scooters. Crutches. Turn on the lights when you go into a dark area. Replace any light bulbs as soon as they burn out. Set up your furniture so you have a clear path. Avoid moving your furniture around. If any of your floors are uneven, fix them. If there are any pets around you, be aware of where they are. Review your medicines with your doctor. Some medicines can make you feel dizzy. This can increase your chance of falling. Ask your doctor what other things that you can do to help prevent falls. This information is not intended to replace advice given to you by your health care provider. Make sure you discuss any questions you have with your health care provider. Document Released: 01/11/2009 Document Revised: 08/23/2015 Document Reviewed: 04/21/2014 Elsevier Interactive Patient Education  2017 [employer].

## 2021-10-10 ENCOUNTER — Telehealth: Payer: Self-pay | Admitting: *Deleted

## 2021-10-10 NOTE — Telephone Encounter (Signed)
   Telephone encounter was:  Unsuccessful.  10/10/2021 Name: Kathy [name] MRN: [id number] DOB: 1950/04/28  Unsuccessful outbound call made today to assist with:  Caregiver Stress  Outreach Attempt:  1st Attempt  A HIPAA compliant voice message was left requesting a return call.  Instructed patient to call back at   Instructed patient to call back at [phone]  at their earliest convenience. Yehuda Mao Greenauer -[facility] Guide , Embedded Care Coordination [facility], Care Management  [phone] [address] [place] , [place] [address] Email : [email or web address]

## 2021-10-15 ENCOUNTER — Telehealth: Payer: Self-pay | Admitting: *Deleted

## 2021-10-15 NOTE — Telephone Encounter (Signed)
   Telephone encounter was:  Unsuccessful.  10/15/2021 Name: Kathy [name] MRN: [id number] DOB: [birth year]  Unsuccessful outbound call made today to assist with:  Food Insecurity  Outreach Attempt:  2nd Attempt  A HIPAA compliant voice message was left requesting a return call.  Instructed patient to call back at   Instructed patient to call back at [phone]  at their earliest convenience. Yehuda Mao Greenauer -[facility] Guide , Embedded Care Coordination [facility], Care Management  [phone] [address] [place] , [place] [address] Email : [email or web address]

## 2021-10-16 ENCOUNTER — Telehealth: Payer: Self-pay | Admitting: *Deleted

## 2021-10-16 NOTE — Telephone Encounter (Signed)
   Telephone encounter was:  Unsuccessful.  10/16/2021 Name: Kathy [name] MRN: [id number] DOB: November 06, 1950  Unsuccessful outbound call made today to assist with:  Caregiver Stress  Outreach Attempt:  3rd Attempt.  Referral closed unable to contact patient.  A HIPAA compliant voice message was left requesting a return call.  Instructed patient to call back at   Instructed patient to call back at [phone]  at their earliest convenience.  Yehuda Mao Greenauer -[facility] Guide , Embedded Care Coordination [facility], Care Management  [phone] [address] [place] , [place] [address] Email : [email or web address]

## 2021-12-23 ENCOUNTER — Encounter: Payer: Self-pay | Admitting: *Deleted

## 2022-01-06 ENCOUNTER — Other Ambulatory Visit: Payer: Self-pay | Admitting: Family Medicine

## 2022-01-06 ENCOUNTER — Ambulatory Visit (INDEPENDENT_AMBULATORY_CARE_PROVIDER_SITE_OTHER): Payer: Medicare PPO | Admitting: Family Medicine

## 2022-01-06 ENCOUNTER — Encounter: Payer: Self-pay | Admitting: Family Medicine

## 2022-01-06 VITALS — BP 136/76 | HR 73 | Temp 97.7°F | Ht 61.0 in | Wt 184.2 lb

## 2022-01-06 DIAGNOSIS — Z1159 Encounter for screening for other viral diseases: Secondary | ICD-10-CM

## 2022-01-06 DIAGNOSIS — Z636 Dependent relative needing care at home: Secondary | ICD-10-CM

## 2022-01-06 DIAGNOSIS — Z0000 Encounter for general adult medical examination without abnormal findings: Secondary | ICD-10-CM | POA: Diagnosis not present

## 2022-01-06 DIAGNOSIS — E785 Hyperlipidemia, unspecified: Secondary | ICD-10-CM

## 2022-01-06 DIAGNOSIS — F439 Reaction to severe stress, unspecified: Secondary | ICD-10-CM | POA: Diagnosis not present

## 2022-01-06 LAB — COMPREHENSIVE METABOLIC PANEL
ALT: 26 U/L (ref 0–35)
AST: 29 U/L (ref 0–37)
Albumin: 4.4 g/dL (ref 3.5–5.2)
Alkaline Phosphatase: 79 U/L (ref 39–117)
BUN: 14 mg/dL (ref 6–23)
CO2: 27 mEq/L (ref 19–32)
Calcium: 9.7 mg/dL (ref 8.4–10.5)
Chloride: 104 mEq/L (ref 96–112)
Creatinine, Ser: 0.72 mg/dL (ref 0.40–1.20)
GFR: 85.07 mL/min (ref >60.00)
Glucose, Bld: 102 mg/dL — ABNORMAL HIGH (ref 70–99)
Potassium: 4.1 mEq/L (ref 3.5–5.1)
Sodium: 140 mEq/L (ref 135–145)
Total Bilirubin: 0.5 mg/dL (ref 0.2–1.2)
Total Protein: 7.7 g/dL (ref 6.0–8.3)

## 2022-01-06 LAB — CBC WITH DIFFERENTIAL/PLATELET
Basophils Absolute: 0 10*3/uL (ref 0.0–0.1)
Basophils Relative: 0.6 % (ref 0.0–3.0)
Eosinophils Absolute: 0.2 10*3/uL (ref 0.0–0.7)
Eosinophils Relative: 3 % (ref 0.0–5.0)
HCT: 41.2 % (ref 36.0–46.0)
Hemoglobin: 13.9 g/dL (ref 12.0–15.0)
Lymphocytes Relative: 35.8 % (ref 12.0–46.0)
Lymphs Abs: 2.1 10*3/uL (ref 0.7–4.0)
MCHC: 33.6 g/dL (ref 30.0–36.0)
MCV: 92.5 fl (ref 78.0–100.0)
Monocytes Absolute: 0.7 10*3/uL (ref 0.1–1.0)
Monocytes Relative: 11.8 % (ref 3.0–12.0)
Neutro Abs: 2.8 10*3/uL (ref 1.4–7.7)
Neutrophils Relative %: 48.8 % (ref 43.0–77.0)
Platelets: 215 10*3/uL (ref 150.0–400.0)
RBC: 4.46 Mil/uL (ref 3.87–5.11)
RDW: 12.8 % (ref 11.5–15.5)
WBC: 5.8 10*3/uL (ref 4.0–10.5)

## 2022-01-06 LAB — LIPID PANEL
Cholesterol: 195 mg/dL (ref 0–200)
HDL: 44.7 mg/dL (ref >39.00)
LDL Cholesterol: 123 mg/dL — ABNORMAL HIGH (ref 0–99)
NonHDL: 150.35
Total CHOL/HDL Ratio: 4
Triglycerides: 139 mg/dL (ref 0.0–149.0)
VLDL: 27.8 mg/dL (ref 0.0–40.0)

## 2022-01-06 MED ORDER — ALPRAZOLAM 0.25 MG PO TABS: 0.2500 mg | ORAL_TABLET | Freq: Two times a day (BID) | ORAL | 0 refills | Status: DC | PRN

## 2022-01-06 MED ORDER — ZOLPIDEM TARTRATE 10 MG PO TABS: 5.0000 mg | ORAL_TABLET | Freq: Every evening | ORAL | 5 refills | Status: DC | PRN

## 2022-01-06 NOTE — Patient Instructions (Addendum)
Sign release of information at the check out desk for [facility] for last colonoscopy  Please call [phone] to schedule a visit with [facility] - please tell the office you were directly referred by Dr. Yong Channel  Please stop by lab before you go If you have mychart- we will send your results within 3 business days of [name] receiving them.  If you do not have mychart- we will call you about results within 5 business days of [name] receiving them.  *please also note that you will see labs on mychart as soon as they post. I will later go in and write notes on them- will say "notes from Dr. Yong Channel"   Recommended follow up: Return in about 1 year (around 01/07/2023) for physical or sooner if needed.Schedule b4 you leave.

## 2022-01-06 NOTE — Addendum Note (Signed)
Addended by: Marin Olp on: 01/06/2022 05:55 PM   Modules accepted: Level of Service

## 2022-01-06 NOTE — Progress Notes (Signed)
Phone [phone]   Subjective:  Patient presents today for their annual physical. Chief complaint-noted.   See problem oriented charting- ROS- full  review of systems was completed and negative other than very spairng GERD_ treated with OTC Mylanta a few weeks ago  The following were reviewed and entered/updated in epic: Past Medical History:  Diagnosis Date   GERD (gastroesophageal reflux disease) 07/02/2021   Sparing omeprazole . Was told hiatal hernia in past    History of heart murmur in childhood 07/02/2021   Hyperlipidemia, unspecified 07/02/2021   Not on meds    White coat syndrome without diagnosis of hypertension 07/02/2021   Diagnosed around age 65    Patient Active Problem List   Diagnosis Date Noted   Hyperlipidemia, unspecified 07/02/2021    Priority: Medium    Anxiety 07/02/2021    Priority: Medium    Caregiver burden 07/02/2021    Priority: Low   White coat syndrome without diagnosis of hypertension 07/02/2021    Priority: Low   GERD (gastroesophageal reflux disease) 07/02/2021    Priority: Low   History of heart murmur in childhood 07/02/2021    Priority: Low   Past Surgical History:  Procedure Laterality Date   CERVICAL SPINE SURGERY     CHOLECYSTECTOMY     COLONOSCOPY      Family History  Problem Relation Age of Onset   Atrial fibrillation Mother    Heart failure Mother    Hypertension Mother    Memory loss Mother    CAD Father        smoker died 49   Hyperlipidemia Father    Diabetes Father     Medications- reviewed and updated Current Outpatient Medications  Medication Sig Dispense Refill   aspirin 81 MG tablet Take 81 mg by mouth daily.     calcium carbonate (OS-CAL) 600 MG TABS tablet Take 600 mg by mouth 2 (two) times daily with a meal.     Cholecalciferol 25 MCG (1000 UT) capsule Take 1,000 Units by mouth daily.     Krill Oil 1000 MG CAPS daily.     MAGNESIUM PO magnesium     vitamin B-12 (CYANOCOBALAMIN) 1000 MCG tablet Take 1,000 mcg  by mouth daily.     ALPRAZolam (XANAX) 0.25 MG tablet Take 1 tablet (0.25 mg total) by mouth 2 (two) times daily as needed for anxiety (do not take for 8 hours after driving and do not take on nights you take ambien). 20 tablet 0   Collagen-Boron-Hyaluronic Acid ([employer] JOINT HEALTH TRIPLE ACTION PO) Take by mouth. (Patient not taking: Reported on 01/06/2022)     zolpidem (AMBIEN) 10 MG tablet Take 0.5-1 tablets (5-10 mg total) by mouth at bedtime as needed for sleep (try to limit to 5 mg most of the time). 20 tablet 5   No current facility-administered medications for this visit.    Allergies-reviewed and updated Allergies  Allergen Reactions   Hydrochlorothiazide     Other reaction(s): low potassium    Social History   Social History Narrative   Married. No siblings. 2 kids. 2 grandkids 7 grandson, almost 4 daughter in 2023.       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    Objective  Objective:  BP 136/76   Pulse 73   Temp 97.7 F (36.5 C)   Ht  5\' 1"  (1.549 m)   Wt 184 lb 3.2 oz (83.6 kg)   SpO2 97%   BMI 34.80 kg/m  Gen: NAD, resting comfortably HEENT: Mucous membranes are moist. Oropharynx normal Neck: no thyromegaly CV: RRR no murmurs rubs or gallops Lungs: CTAB no crackles, wheeze, rhonchi Abdomen: soft/nontender/nondistended/normal bowel sounds. No rebound or guarding.  Ext: no edema Skin: warm, dry Neuro: grossly normal, moves all extremities, PERRLA   Assessment and Plan   71 y.o. female presenting for annual physical.  Health Maintenance counseling: 1. Anticipatory guidance: Patient counseled regarding regular dental exams -q6 months, eye exams - yearly,  avoiding smoking and second hand smoke , limiting alcohol to 1 beverage per day- none in weeks- mainly if at beach or weekends , no illicit drugs .   2. Risk factor reduction:   Advised patient of need for regular exercise and diet rich and fruits and vegetables to reduce risk of heart attack and stroke.  Exercise- walking a mile most days of the week, free weights twice a week and doing some planking Diet/weight management-weight watchers but not tracking right now- feels has hit a plateau- and weight didn't go down even on medicine, calorie tracking in the past. Feels eats reasonably healhty- tried to work through some options Wt Readings from Last 3 Encounters:  01/06/22 184 lb 3.2 oz (83.6 kg)  07/02/21 184 lb 6.4 oz (83.6 kg)  03/16/21 183 lb (83 kg)  3. Immunizations/screenings/ancillary studies- shingrix at pharmacy recommended , covid shot has had recently- team uploading, hcv discussed - opts in, plans on flu shot in november . May consider rsv Immunization History  Administered Date(s) Administered   Influenza Split 02/14/2009, 01/27/2011, 01/31/2014, 12/27/2018   PFIZER Comirnaty(Gray Top)Covid-19 Tri-Sucrose Vaccine 01/03/2022   PFIZER(Purple Top)SARS-COV-2 Vaccination 05/06/2019, 05/31/2019, 01/02/2020, 08/12/2020   Pfizer Covid-19 Vaccine Bivalent Booster 28yrs & up 12/21/2020   Pneumococcal Conjugate-13 07/16/2017   Pneumococcal Polysaccharide-23 10/05/2019   Tdap 01/27/2011, 11/21/2020   Zoster, Live 10/27/2012  4. Cervical cancer screening- GYN retired. past age based screening recommendations-denies history of abnormal exam 5. Breast cancer screening-  breast exam - self exams- and mammogram 04/22/2021 plans yearly 6. Colon cancer screening - none on file- Reports completed 12/01/2012-need records 7. Skin cancer screening- Dr. 01/31/2013. advised regular sunscreen use. Denies worrisome, changing, or new skin lesions.  8. Birth control/STD check- postmenopausal only active with husband 9. Osteoporosis screening at 65- 03/09/2018 normal- consider repeat next year- 5 years out 10. Smoking associated screening - never smoker  Status of chronic or acute  concerns   #Caregiver burden-cares for her mother who is over 100 with some memory changes. Has stressors outside of that caring for grandkids- they have had covid.    #hyperlipidemia S: Medication:Krill oil, prefers aspirin 81 mg for primary prevention - no lipids on file - dad did have CAD by age 42 A/P: update lipids and if risk above 5% due to fmaily history plan on ct cardiac scoring  #Insomnia S: Medication: Ambien 10 mg but have recommended max 5 mg at a time - sometimes can use less than five such as a third tablet A/P: doing ok- refilled today - aware of possible links to memory loss/dementia with long term use- minimize if possible along with alprazolam  # Anxiety S:Medication: Alprazolam 0.25 mg sparingly for anxiety- about twice a week lately -ongoing issues with this between caregiving, building house, and caring for grandkids. No SI.  Counseling: never A/P: ongoing issues with anxiety/stress with  all she is managing right now- she is open to adding therapy to help in addition to having alprazolam on hand- which I refilled today   #Whitecoat elevated blood pressure S: medication: None Home readings #s: 140s/80 a while back- hasnt checked A/P: blood pressure reasonably well contorlled without meds- continue to monitor- ideal #s would be closer 110/70 - can work on this with lifestyle changes    Recommended follow up: Return in about 1 year (around 01/07/2023) for physical or sooner if needed.Schedule b4 you leave. Future Appointments  Date Time Provider Department Center  10/20/2022  9:30 AM [facility]   Lab/Order associations: fasting   ICD-10-CM   1. Preventative health care  Z00.00     2. Encounter for hepatitis C screening test for low risk patient  Z11.59 Hepatitis C antibody    3. Hyperlipidemia, unspecified hyperlipidemia type  E78.5 CBC with Differential/Platelet    Comprehensive metabolic panel    Lipid panel    4. Stress  F43.9  Ambulatory referral to Psychology    5. Caregiver burden  Z63.6 Ambulatory referral to Psychology      Meds ordered this encounter  Medications   ALPRAZolam (XANAX) 0.25 MG tablet    Sig: Take 1 tablet (0.25 mg total) by mouth 2 (two) times daily as needed for anxiety (do not take for 8 hours after driving and do not take on nights you take ambien).    Dispense:  20 tablet    Refill:  0   zolpidem (AMBIEN) 10 MG tablet    Sig: Take 0.5-1 tablets (5-10 mg total) by mouth at bedtime as needed for sleep (try to limit to 5 mg most of the time).    Dispense:  20 tablet    Refill:  5    Return precautions advised.  Tana Conch, MD

## 2022-01-07 LAB — HEPATITIS C ANTIBODY: Hepatitis C Ab: NONREACTIVE

## 2022-02-06 ENCOUNTER — Ambulatory Visit
Admission: RE | Admit: 2022-02-06 | Discharge: 2022-02-06 | Disposition: A | Discharge status: Home / Self Care | Payer: No Typology Code available for payment source | Source: Ambulatory Visit | Attending: Family Medicine | Admitting: Family Medicine

## 2022-02-06 DIAGNOSIS — I2510 Atherosclerotic heart disease of native coronary artery without angina pectoris: Secondary | ICD-10-CM | POA: Diagnosis not present

## 2022-02-06 DIAGNOSIS — E785 Hyperlipidemia, unspecified: Secondary | ICD-10-CM

## 2022-02-11 ENCOUNTER — Ambulatory Visit (INDEPENDENT_AMBULATORY_CARE_PROVIDER_SITE_OTHER): Payer: Medicare PPO | Admitting: Clinical

## 2022-02-11 DIAGNOSIS — F411 Generalized anxiety disorder: Secondary | ICD-10-CM | POA: Diagnosis not present

## 2022-02-11 NOTE — Progress Notes (Signed)
                Ferrah Panagopoulos, LCSW 

## 2022-02-11 NOTE — Progress Notes (Signed)
[facility] Counselor Initial Adult Exam  Name: Kathy [name] Date: 02/11/2022 MRN: [id number] DOB: [birth year] PCP: Shelva Majestic, MD  Time spent: 2:32pm - 3:16pm   Guardian/Payee:  NA    Paperwork requested:  NA  Reason for Visit /Presenting Problem: Patient reported experiencing increased stress and anxiety. Patient reported she talked to her primary care physician and he recommended patient participate in therapy prior to use of medication for treatment. Patient reported she keeps her grandchildren daily during the school year, she and her husband are building a house which is not going well, and she is the caregiver for her mother who will be 102 in December. Patient reported her mother lives in an assisted living facility. Patient reported her mother has a diagnosis of Dementia and her mother wants her to visit more than she can and doesn't want patient to leave when she visits.    Mental Status Exam: Appearance:   Well Groomed     Behavior:  Appropriate  Motor:  Normal  Speech/Language:   Clear and Coherent  Affect:  Appropriate  Mood:  normal  Thought process:  normal  Thought content:    WNL  Sensory/Perceptual disturbances:    WNL  Orientation:  oriented to person, place, time/date, and situation  Attention:  Good  Concentration:  Good  Memory:  WNL  Fund of knowledge:   Good  Insight:    Good  Judgment:   Good  Impulse Control:  Good   Reported Symptoms:  Patient reported experiencing anxiety frequently, difficulty falling asleep, tearful when anxious,  irritability, decreased concentration, increased worry, and feeling on edge. Patient reported symptoms increase throughout the day. Patient reported symptoms started several years ago and reported symptoms have increased over the past year. Patient reported experiencing some symptoms of anxiety after experiencing menopause.   Risk Assessment: Danger to Self:  No Patient denied current and past  suicidal ideation and symptoms of psychosis Self-injurious Behavior: No Danger to Others: No Patient denied current and past homicidal ideation Duty to Warn:no Physical Aggression / Violence:No  Access to Firearms a concern: No  Gang Involvement:No  Patient / guardian was educated about steps to take if suicide or homicide risk level increases between visits: yes While future psychiatric events cannot be accurately predicted, the patient does not currently require acute inpatient psychiatric care and does not currently meet [facility] involuntary commitment criteria.  Substance Abuse History: Current substance abuse: Yes . Patient reported currently drinking a glass of wine occasionally on the weekends when going out to dinner or special occasions. Patient reported no current or past tobacco use. Patient reported no current or past drug use.     Past Psychiatric History:   No previous psychological problems have been observed Outpatient Providers: none History of Psych Hospitalization: No  Psychological Testing:  none    Abuse History:  Victim of: No.,  none    Report needed: No. Victim of Neglect:No. Perpetrator of  none   Witness / Exposure to Domestic Violence: No   Protective Services Involvement: No  Witness to [employer] Violence:  No   Family History:  Family History  Problem Relation Age of Onset   Atrial fibrillation Mother    Heart failure Mother    Hypertension Mother    Memory loss Mother    CAD Father        smoker died 68   Hyperlipidemia Father    Diabetes Father   Alcohol, anxiety, and ADHD -  son per patient's report 02/11/22  Living situation: the patient lives with their family (husband and 18 year old son)  Sexual Orientation: Straight  Relationship Status: married  Name of spouse / other: Rocky Link If a parent, number of children / ages: 2 adult children (ages 17, 68)  Support Systems: husband, college roommate  Surveyor, quantity Stress:  Yes    Income/Employment/Disability: Neurosurgeon: No   Educational History: Education: Risk manager: christian  Any cultural differences that may affect / interfere with treatment:  not applicable   Recreation/Hobbies: reading, spending time at [employer]  Stressors: Financial difficulties   Other: son living with patient and husband, relationship with husband due to current stressors, building a house, caregiver for her mother    Strengths: walking, nightly self care routine, talking to friend  Barriers:  her schedule as it relates to caring for her mother, financial matters related to her mother's finances   Legal History: Pending legal issue / charges: The patient has no significant history of legal issues. History of legal issue / charges:  none  Medical History/Surgical History: reviewed Past Medical History:  Diagnosis Date   GERD (gastroesophageal reflux disease) 07/02/2021   Sparing omeprazole . Was told hiatal hernia in past    History of heart murmur in childhood 07/02/2021   Hyperlipidemia, unspecified 07/02/2021   Not on meds    White coat syndrome without diagnosis of hypertension 07/02/2021   Diagnosed around age 83     Past Surgical History:  Procedure Laterality Date   CERVICAL SPINE SURGERY     CHOLECYSTECTOMY     COLONOSCOPY      Medications: Current Outpatient Medications  Medication Sig Dispense Refill   ALPRAZolam (XANAX) 0.25 MG tablet Take 1 tablet (0.25 mg total) by mouth 2 (two) times daily as needed for anxiety (do not take for 8 hours after driving and do not take on nights you take ambien). 20 tablet 0   aspirin 81 MG tablet Take 81 mg by mouth daily.     Cholecalciferol 25 MCG (1000 UT) capsule Take 1,000 Units by mouth daily.     Krill Oil 1000 MG CAPS daily.     MAGNESIUM PO magnesium     vitamin B-12 (CYANOCOBALAMIN) 1000 MCG tablet Take 1,000 mcg by mouth daily.     zolpidem  (AMBIEN) 10 MG tablet Take 0.5-1 tablets (5-10 mg total) by mouth at bedtime as needed for sleep (try to limit to 5 mg most of the time). 20 tablet 5   No current facility-administered medications for this visit.  Crestor 10mg  once daily per patient on 02/11/22 she was recently prescribed this medication but has not started taking medication yet  Allergies  Allergen Reactions   Hydrochlorothiazide     Other reaction(s): low potassium    Diagnoses:  Generalized anxiety disorder  Plan of Care: Patient is a 71 year old female who presented for an initial assessment. Patient reported she was referred for therapy by her primary care provider due to increased stress and anxiety. Patient reported the following symptoms: experiencing anxiety frequently, difficulty falling asleep, tearful when anxious,  irritability, decreased concentration, increased worry, and feeling on edge. Patient reported symptoms increase throughout the day. Patient reported symptoms started several years ago and reported symptoms have increased over the past year. Patient reported experiencing some symptoms of anxiety after experiencing menopause. Patient denied current and past suicidal ideation, homicidal ideation, and symptoms of psychosis. Patient reported currently  drinking a glass of wine occasionally on the weekends. Patient reported no current or past tobacco or drug use. Patient reported no history of inpatient or outpatient psychiatric treatment. Patient reported she lives with her husband and her 71 year old son. Patient reported the following stressors: finances, son living with patient and husband, relationship with husband due to current stressors, building a house, and caregiving for her mother. Patient identified her husband and college roommate as supports. It is recommended patient be referred to a psychiatrist for a medication management consult if symptoms persist and recommended patient participate in individual  therapy. Clinician will review recommendations and treatment plan with patient during follow up appointment.  Doree Barthel, LCSW

## 2022-02-13 ENCOUNTER — Other Ambulatory Visit: Payer: Self-pay

## 2022-02-13 ENCOUNTER — Telehealth: Payer: Self-pay | Admitting: Family Medicine

## 2022-02-13 MED ORDER — ROSUVASTATIN CALCIUM 10 MG PO TABS: 10.0000 mg | ORAL_TABLET | Freq: Every day | ORAL | 3 refills | Status: DC

## 2022-02-13 NOTE — Telephone Encounter (Signed)
Pt states: -returning a call.  Pt requests: -call right back.

## 2022-02-14 NOTE — Telephone Encounter (Signed)
Spoke to pt told her returning call about results. Pt said she talked to someone yesterday and they were sending in a statin. Asked pt any further questions? Pt said no. Told her okay have a good day. Pt verbalized understanding.

## 2022-03-03 ENCOUNTER — Ambulatory Visit (INDEPENDENT_AMBULATORY_CARE_PROVIDER_SITE_OTHER): Payer: Medicare PPO | Admitting: Clinical

## 2022-03-03 DIAGNOSIS — F411 Generalized anxiety disorder: Secondary | ICD-10-CM | POA: Diagnosis not present

## 2022-03-03 NOTE — Progress Notes (Signed)
  [facility] Counselor/Therapist Progress Note  Patient ID: Kathy [name], MRN: [id number]    Date: 03/03/22  Time Spent: 10:34  am - 11:19 am : 45 Minutes  Treatment Type: Individual Therapy.  Reported Symptoms: Patient reported feeling "a little down today".   Mental Status Exam: Appearance:  Well Groomed     Behavior: Appropriate  Motor: Normal  Speech/Language:  Clear and Coherent  Affect: Appropriate  Mood: depressed  Thought process: normal  Thought content:   WNL  Sensory/Perceptual disturbances:   WNL  Orientation: oriented to person, place, and situation  Attention: Good  Concentration: Good  Memory: WNL  Fund of knowledge:  Good  Insight:   Good  Judgment:  Good  Impulse Control: Good   Risk Assessment: Danger to Self:  No Patient denied current suicidal ideation Self-injurious Behavior: No Danger to Others: No Patient denied current homicidal ideation Duty to Warn:no Physical Aggression / Violence:No  Access to Firearms a concern: No  Gang Involvement:No   Subjective:  Patient stated, "pretty much the same" since last session.  Patient reported feeling "up and down" as it relates to mood since last session. Patient reported she tries to think positively as much as possible. Patient stated, "I think that's what they wrote in my chart years ago" and stated, "I'm not surprised" in response to the diagnosis. Patient stated, "I don't have a problem with that" in response to the recommendation for a referral to a psychiatrist. Patient stated, "that's something that Dr. Durene Cal recommended" and stated "I'm ok with that too" in response to recommendation for therapy. Patient stated, "I want to reduce the feelings of anxiety that I have and find more positive ways to deal with that" when clinician inquired about potential goals for therapy. Patient reported she would like to learn to cope with the feelings of guilt as it relates to caring for her mother.  Patient reported her mother was diagnosed with Alzheimer's Disease. Patient reported she has some information about dementia but has not participated in support group. Patient reported she is open to attending a caregiver support group.   Interventions: Motivational Interviewing. Clinician conducted session via WebEx video from clinician's home office. Patient provided verbal consent to proceed with telehealth session and participated in session from patient's home. Clinician reviewed diagnosis and treatment recommendations. Provided psycho education related to diagnosis and treatment. Clinician utilized motivational interviewing to explore potential goals for therapy. Discussed recommendation for participation in a caregiver support group and provided local caregiver support group resources. Provided referral information for psychiatrists in the area.    Diagnosis:  Generalized anxiety disorder   Plan: Goals to be completed during next appointment 03/28/22.                     Doree Barthel, LCSW

## 2022-03-10 ENCOUNTER — Ambulatory Visit (INDEPENDENT_AMBULATORY_CARE_PROVIDER_SITE_OTHER): Payer: Medicare PPO | Admitting: Physician Assistant

## 2022-03-10 ENCOUNTER — Other Ambulatory Visit: Payer: Self-pay | Admitting: Physician Assistant

## 2022-03-10 ENCOUNTER — Encounter: Payer: Self-pay | Admitting: Physician Assistant

## 2022-03-10 VITALS — BP 138/82 | HR 75 | Temp 97.5°F | Ht 61.0 in | Wt 182.6 lb

## 2022-03-10 DIAGNOSIS — R062 Wheezing: Secondary | ICD-10-CM

## 2022-03-10 DIAGNOSIS — G4700 Insomnia, unspecified: Secondary | ICD-10-CM | POA: Insufficient documentation

## 2022-03-10 DIAGNOSIS — R0981 Nasal congestion: Secondary | ICD-10-CM

## 2022-03-10 MED ORDER — PREDNISONE 20 MG PO TABS: 20.0000 mg | ORAL_TABLET | Freq: Two times a day (BID) | ORAL | 0 refills | Status: AC

## 2022-03-10 MED ORDER — AMOXICILLIN-POT CLAVULANATE 875-125 MG PO TABS: 1.0000 | ORAL_TABLET | Freq: Two times a day (BID) | ORAL | 0 refills | Status: AC

## 2022-03-10 MED ORDER — AZELASTINE HCL 0.1 % NA SOLN: 2.0000 | Freq: Two times a day (BID) | NASAL | 1 refills | Status: AC

## 2022-03-10 NOTE — Progress Notes (Signed)
Subjective:    Patient ID: Aurelio Brash, female    DOB: May 27, 1950, 71 y.o.   MRN: [id number]  Chief Complaint  Patient presents with   Nasal Congestion    Pt in office for nasal congestion and drainage X 2 wks; not taking covid test; congestion, no sore throat, or other symptoms, bloody nasal mucus the past few days, cough but to cough up mucus. No fever    HPI Patient is in today for acute sick symptoms.  States for the last 2 weeks she has had a lot of nasal congestion and drainage.  Feels like she has a phlegmy cough going on as well.  Denies any fever or chills.  No headaches.  No sore throat.  No chest pain or shortness of breath.  States that her granddaughter was recently sick but not sure with what.  She has been using a Nettie pot and nasal saline.  She is using a vaporizer at home.  Takes Sudafed as needed.  Past Medical History:  Diagnosis Date   GERD (gastroesophageal reflux disease) 07/02/2021   Sparing omeprazole . Was told hiatal hernia in past    History of heart murmur in childhood 07/02/2021   Hyperlipidemia, unspecified 07/02/2021   Not on meds    White coat syndrome without diagnosis of hypertension 07/02/2021   Diagnosed around age 62     Past Surgical History:  Procedure Laterality Date   CERVICAL SPINE SURGERY     CHOLECYSTECTOMY     COLONOSCOPY      Family History  Problem Relation Age of Onset   Atrial fibrillation Mother    Heart failure Mother    Hypertension Mother    Memory loss Mother    CAD Father        smoker died 4   Hyperlipidemia Father    Diabetes Father     Social History   Tobacco Use   Smoking status: Never   Smokeless tobacco: Never  Vaping Use   Vaping Use: Never used  Substance Use Topics   Alcohol use: Yes    Comment: twice a month  or up to once a week   Drug use: Never     Allergies  Allergen Reactions   Hydrochlorothiazide     Other reaction(s): low potassium    Review of Systems NEGATIVE UNLESS OTHERWISE  INDICATED IN HPI      Objective:     BP 138/82 (BP Location: Right Arm)   Pulse 75   Temp (!) 97.5 F (36.4 C) (Temporal)   Ht 5\' 1"  (1.549 m)   Wt 182 lb 9.6 oz (82.8 kg)   SpO2 99%   BMI 34.50 kg/m   Wt Readings from Last 3 Encounters:  03/10/22 182 lb 9.6 oz (82.8 kg)  01/06/22 184 lb 3.2 oz (83.6 kg)  07/02/21 184 lb 6.4 oz (83.6 kg)    BP Readings from Last 3 Encounters:  03/10/22 138/82  01/06/22 136/76  07/02/21 130/70     Physical Exam Vitals and nursing note reviewed.  Constitutional:      Appearance: Normal appearance.  HENT:     Right Ear: Tympanic membrane, ear canal and external ear normal.     Left Ear: Tympanic membrane, ear canal and external ear normal.     Nose: Congestion present.     Right Turbinates: Enlarged and swollen.     Left Turbinates: Enlarged and swollen.     Right Sinus: No maxillary sinus tenderness or frontal  sinus tenderness.     Left Sinus: No maxillary sinus tenderness or frontal sinus tenderness.     Mouth/Throat:     Mouth: Mucous membranes are moist.     Pharynx: No oropharyngeal exudate or posterior oropharyngeal erythema.  Eyes:     Extraocular Movements: Extraocular movements intact.     Conjunctiva/sclera: Conjunctivae normal.     Pupils: Pupils are equal, round, and reactive to light.  Cardiovascular:     Rate and Rhythm: Normal rate and regular rhythm.     Pulses: Normal pulses.     Heart sounds: No murmur heard. Pulmonary:     Effort: Pulmonary effort is normal.     Breath sounds: Wheezing and rhonchi present.  Neurological:     Mental Status: She is alert.  Psychiatric:        Mood and Affect: Mood normal.        Assessment & Plan:  Nasal congestion  Wheezing  Other orders -     predniSONE; Take 1 tablet (20 mg total) by mouth 2 (two) times daily with a meal for 5 days.  Dispense: 10 tablet; Refill: 0 -     Azelastine HCl; Place 2 sprays into both nostrils 2 (two) times daily. Use in each nostril as  directed  Dispense: 30 mL; Refill: 1 -     Amoxicillin-Pot Clavulanate; Take 1 tablet by mouth 2 (two) times daily for 7 days.  Dispense: 14 tablet; Refill: 0   Acute cold / URI symptoms. Some wheezing noted on exam. Start on prednisone and azelastine nasal spray as directed. Cont fluids / netti pot / vaporizer. If still no improvement or worse symptoms, may start on augmentin later this week to cover possible bacterial etiology.   Return if symptoms worsen or fail to improve.  This note was prepared with assistance of Conservation officer, historic buildings. Occasional wrong-word or sound-a-like substitutions may have occurred due to the inherent limitations of voice recognition software.      Dodie Parisi M Marinell Igarashi, PA-C

## 2022-03-25 ENCOUNTER — Telehealth: Payer: Self-pay | Admitting: Family Medicine

## 2022-03-25 NOTE — Telephone Encounter (Signed)
Dr Sharen Hones ordered Tamiflu 75mg  caps - 1 cap 2x day for 5 days. Verbal orders to be signed, placed in provider's box.    Patient Name: Kathy [name] ON Gender: Female DOB: 1950/07/11 Age: 71 Y 11 D Return Phone Number: [phone] (Primary) Address: City/ State/ Zip: [place]  [place] Client [facility] [id number] [facility] Human resources officer Provider [employer]- MD Contact Type Call Who Is Calling Patient / Member / Family / Caregiver Call Type Triage / Clinical Relationship To Patient Self Return Phone Number [phone] (Primary) Chief Complaint Cough Reason for Call Symptomatic / Request for Health Information Initial Comment Caller states she has flu A, she is needing an rx sent to the Pharmacy. Pt is experiencing fever 99.0, chills, cough, and congestion. Translation No Nurse Assessment Nurse: [phone], RN, Mila Palmer Date/Time Verlon Au Time): 03/23/2022 12:43:49 PM Confirm and document reason for call. If symptomatic, describe symptoms. ---Caller states that she is positive for Flu A by rapid test at the pharmacy. The pharmacist told her to call her doctor for tamiflu. Sx started yesterday with fever that is now resolved. Cough and congestion as well. Does the patient have any new or worsening symptoms? ---Yes Will a triage be completed? ---Yes Related visit to physician within the last 2 weeks? ---No Does the PT have any chronic conditions? (i.e. diabetes, asthma, this includes High risk factors for pregnancy, etc.) ---Yes List chronic conditions. ---cholesterol Is this a behavioral health or substance abuse call? ---No Guidelines Guideline Title Affirmed Question Affirmed Notes Nurse Date/Time (Eastern Time) Influenza (Flu) - Seasonal Patient is HIGH RISK (e.g., age > 61 years, pregnant, HIV 78, Mila Palmer 03/23/2022 12:45:34 PM Guidelines Guideline Title Affirmed Question Affirmed  Notes Nurse Date/Time 03/25/2022 Time) +, or chronic medical condition) Disp. Time Lamount Cohen Time) Disposition Final User 03/23/2022 12:32:03 PM Attempt made - no message left 03/25/2022 03/23/2022 12:48:02 PM Call PCP within 24 Hours Yes 03/25/2022 03/23/2022 12:53:00 PM Paged On Call back to [facility], [facility] 03/23/2022 1:15:09 PM Pharmacy Call 03/25/2022, RN, Mila Palmer Reason: Called VO to pharmacy VM Final Disposition 03/23/2022 12:48:02 PM Call PCP within 24 Hours Yes 03/25/2022, RN, Mila Palmer Disagree/Comply Comply Caller Understands Yes PreDisposition Call Doctor Care Advice Given Per Guideline CALL PCP WITHIN 24 HOURS: * IF OFFICE WILL BE CLOSED: I'll page the on-call provider now. EXCEPTION: from 9 pm to 9 am. Since this isn't urgent, we'll hold the page until morning. CARE ADVICE given per Influenza - Seasonal (Adult) guideline. * You become worse CALL BACK IF: Verbal Orders/Maintenance Medications Medication Refill Route Dosage Regime Duration Admin Instructions User Name Tamiflu 75 mg caps Oral 1 cap twice a day 5 Days Rocky Link, RN, Mila Palmer DoctorName Phone DateTime Result/ Outcome Message Type Notes Clide Cliff - MD Eustaquio Boyden 03/23/2022 12:53:00 PM Paged On Call Back to Call Center Doctor Paged Pt is a 71 yo female who tested positive for Flu A at her pharamcy today, sx started yesterday. The pharmacist told her to call her doctor becuse she is high risk (age). Our triage outcomes is to contact MD for directions as well. How would you like me to handle this Paging DoctorName Phone DateTime Result/ Outcome Message Type Notes patient? [facility] RN AccessNurse [phone] - MD 03/23/2022 1:11:02 PM Spoke with On Call - General Message Result VO tamiflu 75 mg po BID x 5 days

## 2022-03-28 ENCOUNTER — Ambulatory Visit: Payer: Medicare PPO | Admitting: Clinical

## 2022-04-07 ENCOUNTER — Ambulatory Visit (INDEPENDENT_AMBULATORY_CARE_PROVIDER_SITE_OTHER): Payer: Medicare PPO | Admitting: Clinical

## 2022-04-07 DIAGNOSIS — F411 Generalized anxiety disorder: Secondary | ICD-10-CM | POA: Diagnosis not present
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
## 2022-04-08 ENCOUNTER — Ambulatory Visit (INDEPENDENT_AMBULATORY_CARE_PROVIDER_SITE_OTHER): Payer: Medicare PPO | Admitting: Family Medicine

## 2022-04-08 ENCOUNTER — Encounter: Payer: Self-pay | Admitting: Family Medicine

## 2022-04-08 VITALS — BP 132/78 | HR 83 | Temp 99.1°F | Ht 61.0 in | Wt 182.2 lb

## 2022-04-08 DIAGNOSIS — J40 Bronchitis, not specified as acute or chronic: Secondary | ICD-10-CM | POA: Diagnosis not present

## 2022-04-08 MED ORDER — AZITHROMYCIN 250 MG PO TABS: ORAL_TABLET | ORAL | 0 refills | Status: AC

## 2022-04-08 MED ORDER — BENZONATATE 100 MG PO CAPS: 100.0000 mg | ORAL_CAPSULE | Freq: Three times a day (TID) | ORAL | 0 refills | Status: DC | PRN

## 2022-04-08 NOTE — Patient Instructions (Signed)
Bronchitis-  meds sent to pharmacy  Worse, no change, let us know

## 2022-04-08 NOTE — Progress Notes (Signed)
Subjective:     Patient ID: Kathy [name], female    DOB: [birth year], 72 y.o.   MRN: [id number]  Chief Complaint  Patient presents with   Cough    Productive cough, not getting better Completed medication from dx of flu on 03/23/22 Took OTC medication and nasal spray, tylenol    Nasal Congestion    Not getting better Negative Covid test this morning    HPI Seen 03/10/22 after 2 wks of nasal congestion, cough. Took pred and augmenttin, then got better x slight cough,  then exposed to flu 12/19 and symptoms on 12/23 and went to UC and + flu A. Cough-productive.  Not getting better.  Dx flu a on 12/24 and took tamiflu-was getting better.  Then 1 wk ago, started back-cough.  Still congested.  No f/c, no body aches.  Covid neg at home this am.  Helps care for grandchild in day care(not sick).   There are no preventive care reminders to display for this patient.  Past Medical History:  Diagnosis Date   GERD (gastroesophageal reflux disease) 07/02/2021   Sparing omeprazole . Was told hiatal hernia in past    History of heart murmur in childhood 07/02/2021   Hyperlipidemia, unspecified 07/02/2021   Not on meds    White coat syndrome without diagnosis of hypertension 07/02/2021   Diagnosed around age 68     Past Surgical History:  Procedure Laterality Date   CERVICAL SPINE SURGERY     CHOLECYSTECTOMY     COLONOSCOPY      Outpatient Medications Prior to Visit  Medication Sig Dispense Refill   ALPRAZolam (XANAX) 0.25 MG tablet Take 1 tablet (0.25 mg total) by mouth 2 (two) times daily as needed for anxiety (do not take for 8 hours after driving and do not take on nights you take ambien). 20 tablet 0   aspirin 81 MG tablet Take 81 mg by mouth daily.     azelastine (ASTELIN) 0.1 % nasal spray Place 2 sprays into both nostrils 2 (two) times daily. Use in each nostril as directed 30 mL 1   Cholecalciferol 25 MCG (1000 UT) capsule Take 1,000 Units by mouth daily.     Krill Oil 1000 MG CAPS  daily.     MAGNESIUM PO magnesium     rosuvastatin (CRESTOR) 10 MG tablet Take 1 tablet (10 mg total) by mouth daily. 90 tablet 3   vitamin B-12 (CYANOCOBALAMIN) 1000 MCG tablet Take 1,000 mcg by mouth daily.     zolpidem (AMBIEN) 10 MG tablet Take 0.5-1 tablets (5-10 mg total) by mouth at bedtime as needed for sleep (try to limit to 5 mg most of the time). 20 tablet 5   No facility-administered medications prior to visit.    Allergies  Allergen Reactions   Hydrochlorothiazide     Other reaction(s): low potassium   ROS neg/noncontributory except as noted HPI/below      Objective:     BP 132/78 (BP Location: Left Arm, Patient Position: Sitting, Cuff Size: Normal)   Pulse 83   Temp 99.1 F (37.3 C) (Temporal)   Ht 5\' 1"  (1.549 m)   Wt 182 lb 4 oz (82.7 kg)   SpO2 96%   BMI 34.44 kg/m  Wt Readings from Last 3 Encounters:  04/08/22 182 lb 4 oz (82.7 kg)  03/10/22 182 lb 9.6 oz (82.8 kg)  01/06/22 184 lb 3.2 oz (83.6 kg)    Physical Exam   Gen: WDWN NAD HEENT: NCAT,  conjunctiva not injected, sclera nonicteric TM WNL B, OP moist, no exudates  congested.  Sinuses nt NECK:  supple, no thyromegaly, no nodes,  CARDIAC: RRR, S1S2+, no murmur.  LUNGS: CTAB. No wheezes EXT:  no edema MSK: no gross abnormalities.  NEURO: A&O x3.  CN II-XII intact.  PSYCH: normal mood. Good eye contact     Assessment & Plan:   Problem List Items Addressed This Visit   None Visit Diagnoses     Bronchitis    -  Primary      Bronchitis-not sure if post flu or new bug.  Zpk, tessalon perles 100mg  tid prn.  Worse, no change, let know  Meds ordered this encounter  Medications   azithromycin (ZITHROMAX) 250 MG tablet    Sig: Take 2 tablets on day 1, then 1 tablet daily on days 2 through 5    Dispense:  6 tablet    Refill:  0   benzonatate (TESSALON PERLES) 100 MG capsule    Sig: Take 1 capsule (100 mg total) by mouth 3 (three) times daily as needed for cough.    Dispense:  20 capsule     Refill:  0    [name], MD

## 2022-04-25 ENCOUNTER — Encounter: Payer: Self-pay | Admitting: Physician Assistant

## 2022-04-25 ENCOUNTER — Telehealth (INDEPENDENT_AMBULATORY_CARE_PROVIDER_SITE_OTHER): Payer: Medicare PPO | Admitting: Physician Assistant

## 2022-04-25 VITALS — Wt 177.0 lb

## 2022-04-25 DIAGNOSIS — U071 COVID-19: Secondary | ICD-10-CM | POA: Diagnosis not present

## 2022-04-25 MED ORDER — NIRMATRELVIR/RITONAVIR (PAXLOVID)TABLET: 3.0000 | ORAL_TABLET | Freq: Two times a day (BID) | ORAL | 0 refills | Status: AC

## 2022-04-25 NOTE — Progress Notes (Signed)
   Virtual Visit via Video Note  I connected with  Kathy [name]  on 04/25/22 at 11:30 AM EST by a video enabled telemedicine application and verified that I am speaking with the correct person using two identifiers.  Location: Patient: home Provider: Therapist, music at [place] present: Patient and myself   I discussed the limitations of evaluation and management by telemedicine and the availability of in person appointments. The patient expressed understanding and agreed to proceed.   History of Present Illness:  Chief complaint: COVID-19 positive  Symptom onset: Wednesday 04/23/22 Pertinent positives: Fatigue, Nasal congestion, Body aches, HA  Pertinent negatives: SOB, CP, N/v/d  Treatments tried: Aleve, Vit D  Vaccine status: COVID and flu vaccines UTD  Sick exposure: Son & husband also with COVID    Observations/Objective:  97% on SpO2  Gen: Awake, alert, no acute distress, some congestion noted Resp: Breathing is even and non-labored Psych: calm/pleasant demeanor Neuro: Alert and Oriented x 3, + facial symmetry, speech is clear.   Assessment and Plan:  1. COVID-19 Diagnosis confirmed via home antigen test.  We discussed current algorithm recommendations for prescribing outpatient antivirals. Patient is over the age of 19, within 5 day window of symptom onset, and interested in [place]. Risks versus benefits discussed. Rx sent. Advised to hold cholesterol medication x 7 days.  Advised self-isolation at home for the next 5 days and then masking around others for at least an additional 5 days.  Treat supportively at this time including sleeping prone, deep breathing exercises, pushing fluids, walking every few hours, vitamins C and D, and Tylenol or ibuprofen as needed.  The patient understands that COVID-19 illness can wax and wane.  Should the symptoms acutely worsen or patient starts to experience sudden shortness of breath, chest pain, severe  weakness, the patient will go straight to the emergency department.  Also advised home pulse oximetry monitoring and for any reading consistently under 92%, should also report to the emergency department.  The patient will continue to keep [name] updated.    Follow Up Instructions:    I discussed the assessment and treatment plan with the patient. The patient was provided an opportunity to ask questions and all were answered. The patient agreed with the plan and demonstrated an understanding of the instructions.   The patient was advised to call back or seek an in-person evaluation if the symptoms worsen or if the condition fails to improve as anticipated.  Rielly Corlett M Daryle Boyington, PA-C

## 2022-04-25 NOTE — Patient Instructions (Addendum)
Feel better soon! Hold your cholesterol medication (Crestor) x 7 days while on Paxlovid. Resume once completed course.   Call if any concerns.

## 2022-04-28 ENCOUNTER — Ambulatory Visit (INDEPENDENT_AMBULATORY_CARE_PROVIDER_SITE_OTHER): Payer: Medicare PPO | Admitting: Clinical

## 2022-04-28 DIAGNOSIS — F411 Generalized anxiety disorder: Secondary | ICD-10-CM | POA: Diagnosis not present
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
## 2022-05-22 ENCOUNTER — Ambulatory Visit: Payer: Medicare PPO | Admitting: Clinical

## 2022-05-27 ENCOUNTER — Other Ambulatory Visit: Payer: Self-pay | Admitting: Family Medicine

## 2022-06-02 ENCOUNTER — Ambulatory Visit: Payer: Medicare PPO | Admitting: Clinical

## 2022-06-06 ENCOUNTER — Ambulatory Visit (INDEPENDENT_AMBULATORY_CARE_PROVIDER_SITE_OTHER): Payer: Medicare PPO | Admitting: Clinical

## 2022-06-06 DIAGNOSIS — F411 Generalized anxiety disorder: Secondary | ICD-10-CM | POA: Diagnosis not present

## 2022-06-06 NOTE — Progress Notes (Signed)
[place] Counselor/Therapist Progress Note  Patient ID: Kathy [name], MRN: [id number],    Date: 06/06/2022  Time Spent: 12:35pm - 1:30pm : 55 minutes  Treatment Type: Individual Therapy  Reported Symptoms: Patient reported feeling anxious in response to recent stressors.   Mental Status Exam: Appearance:  Well Groomed     Behavior: Appropriate  Motor: Normal  Speech/Language:  Clear and Coherent  Affect: Appropriate  Mood: anxious  Thought process: normal  Thought content:   WNL  Sensory/Perceptual disturbances:   WNL  Orientation: oriented to person, place, and situation  Attention: Good  Concentration: Good  Memory: WNL  Fund of knowledge:  Good  Insight:   Good  Judgment:  Good  Impulse Control: Good   Risk Assessment: Danger to Self:  No Patient denied current suicidal ideation  Self-injurious Behavior: No Danger to Others: No Patient denied current homicidal ideation  Duty to Warn:no Physical Aggression / Violence:No  Access to Firearms a concern: No  Gang Involvement:No   Subjective: Patient reported she has been feeling well since last session. Patient stated, "pretty ok" in response to mood since last session. Patient reported feeling anxious in response to the building progress for their new home and potential upcoming move in two weeks.  Patient stated, "last night was a hard conversation with my mom". Patient reported her mother wants patient to visit more frequently and becomes upset when patient leaves. Patient reported she calls her mother daily and reminds her mother of activities, such as, television programs her mother enjoys. Patient reported her mother frequently repeats herself and is experiencing difficulty managing passages of time. Patient reported her mother prefers for patient to remain on the phone with mother during basketball games and prefers patient watch television with her during visits. Patient reported her mother currently  resides in a long term care facility. Patient reported her mother's health has declined recently. Patient reported her mother was diagnosed with Alzheimer's Disease. Patient reported her mother is able to recall long term memories more frequently than short term memories. Patient stated, "I try to remind myself she can't help this, this is the disease". Patient reported she is delaying participation in a support group while she is in the process of moving.    Interventions: Cognitive Behavioral Therapy. Clinician conducted session via WebEx video from clinician's home office. Patient provided verbal consent to proceed with telehealth session and participated in session from patient's home. Reviewed events since last session. Explored and identified triggers for symptoms of anxiety. Provided psycho education related to symptoms/changes associated with Alzheimer's Disease and strategies to utilize in response. Reviewed recommendation for caregiver support group. Provided active and reflective listening, provided validation.    Diagnosis:  Generalized anxiety disorder     Plan: Patient is to utilize [employer] [facility], thought re-framing, relaxation techniques, and coping strategies to decrease symptoms associated with Generalized Anxiety Disorder. Frequency: bi-weekly  Modality: individual      Long-term goal:   Reduce overall level, frequency, and intensity of the feelings of anxiety as evidenced by decrease in feeling anxious, difficulty falling asleep, tearful when feeling anxious, irritability, changes in concentration, worry, tightness in chest, and feeling on edge from 7 days/week to 0 to 1 days/week per patient report for at least 3 consecutive months. Target Date: 04/29/23  Progress: progressing    Short-term goal:  Verbally express an understanding of the relationship between feelings of anxiety and the impact on thinking patterns and behaviors.   Target Date: 10/27/22  Progress:  progressing      Verbalize an understanding of the role that distorted thinking plays in creating excessive worry, feelings of guilt, and "feeling of dread"   Target Date: 10/27/22  Progress: progressing    Decrease daily level of anxiety by developing healthy coping mechanisms to utilize in response   Target Date: 10/27/22  Progress: progressing                   Katherina Right, LCSW

## 2022-06-06 NOTE — Progress Notes (Signed)
                Lucette Kratz, LCSW 

## 2022-06-12 ENCOUNTER — Other Ambulatory Visit: Payer: Self-pay

## 2022-06-12 ENCOUNTER — Emergency Department (HOSPITAL_BASED_OUTPATIENT_CLINIC_OR_DEPARTMENT_OTHER)
Admission: EM | Admit: 2022-06-12 | Discharge: 2022-06-12 | Disposition: A | Discharge status: Home / Self Care | Payer: Medicare PPO | Attending: Emergency Medicine | Admitting: Emergency Medicine

## 2022-06-12 ENCOUNTER — Encounter (HOSPITAL_BASED_OUTPATIENT_CLINIC_OR_DEPARTMENT_OTHER): Payer: Self-pay | Admitting: Pediatrics

## 2022-06-12 ENCOUNTER — Emergency Department (HOSPITAL_BASED_OUTPATIENT_CLINIC_OR_DEPARTMENT_OTHER): Payer: Medicare PPO

## 2022-06-12 ENCOUNTER — Telehealth: Payer: Self-pay | Admitting: Family Medicine

## 2022-06-12 DIAGNOSIS — I10 Essential (primary) hypertension: Secondary | ICD-10-CM | POA: Diagnosis not present

## 2022-06-12 DIAGNOSIS — R002 Palpitations: Secondary | ICD-10-CM | POA: Insufficient documentation

## 2022-06-12 DIAGNOSIS — Z7982 Long term (current) use of aspirin: Secondary | ICD-10-CM | POA: Diagnosis not present

## 2022-06-12 DIAGNOSIS — I4891 Unspecified atrial fibrillation: Secondary | ICD-10-CM | POA: Diagnosis not present

## 2022-06-12 LAB — BASIC METABOLIC PANEL
Anion gap: 5 (ref 5–15)
BUN: 15 mg/dL (ref 8–23)
CO2: 23 mmol/L (ref 22–32)
Calcium: 9.3 mg/dL (ref 8.9–10.3)
Chloride: 109 mmol/L (ref 98–111)
Creatinine, Ser: 0.57 mg/dL (ref 0.44–1.00)
GFR, Estimated: >60 mL/min (ref >60)
Glucose, Bld: 154 mg/dL — ABNORMAL HIGH (ref 70–99)
Potassium: 3.5 mmol/L (ref 3.5–5.1)
Sodium: 137 mmol/L (ref 135–145)

## 2022-06-12 LAB — CBC
HCT: 43.7 % (ref 36.0–46.0)
Hemoglobin: 14.6 g/dL (ref 12.0–15.0)
MCH: 30 pg (ref 26.0–34.0)
MCHC: 33.4 g/dL (ref 30.0–36.0)
MCV: 89.9 fL (ref 80.0–100.0)
Platelets: 226 10*3/uL (ref 150–400)
RBC: 4.86 MIL/uL (ref 3.87–5.11)
RDW: 13.1 % (ref 11.5–15.5)
WBC: 8.3 10*3/uL (ref 4.0–10.5)
nRBC: 0 % (ref 0.0–0.2)

## 2022-06-12 NOTE — ED Triage Notes (Signed)
Reports hx of afib and had an elevation of HR to 150 and felt some palpitations. Stated episode lasted more than 10 minutes,

## 2022-06-12 NOTE — Telephone Encounter (Signed)
See below, do you want to f/u with pt?

## 2022-06-12 NOTE — Telephone Encounter (Signed)
Patient states: - Around 3pm starting experiencing " A-fib attack" that lasted 5-10 minutes - HR as high as 143 and lowered to 95  - No other symptoms  Patient has been transferred to triage.

## 2022-06-12 NOTE — Telephone Encounter (Signed)
I went to call patient but noted she was in the emergency department-please check in on status of her situation tomorrow

## 2022-06-12 NOTE — ED Notes (Signed)
Discharge instructions reviewed with patient. Patient verbalizes understanding, no further questions at this time. Medications and follow up information provided. No acute distress noted at time of departure.  

## 2022-06-12 NOTE — ED Provider Notes (Signed)
[facility] Provider Note   CSN: [id number] Arrival date & time: 06/12/22  1613     History  Chief Complaint  Patient presents with   Atrial Fibrillation    Kathy [name] is a 72 y.o. female.   Kathy [name] is a 72 year old female with a history of hyperlipidemia, anxiety, reflux who presents today for evaluation of atrial fibrillation that she says was .  She reports that she had an episode earlier today, "it is gone now".  She reports that back in December her son's Apple Watch picked up an episode of atrial fibrillation so she presented to the emergency department but her EKG at that time was normal.  She notes that her mother has a history of paroxysmal A-fib.  Reviewed ED note from December which notes that she has had history of intermittent palpitations for several years, that day they were more persistent.  Lab work at that time was unremarkable.  EKG was sinus rhythm with a rate of 82.       Home Medications Prior to Admission medications   Medication Sig Start Date End Date Taking? Authorizing Provider  ALPRAZolam (XANAX) 0.25 MG tablet Take 1 tablet (0.25 mg total) by mouth 2 (two) times daily as needed for anxiety (do not take for 8 hours after driving and do not take on nights you take ambien). 01/06/22   Marin Olp, MD  aspirin 81 MG tablet Take 81 mg by mouth daily.    [provider]  azelastine (ASTELIN) 0.1 % nasal spray Place 2 sprays into both nostrils 2 (two) times daily. Use in each nostril as directed 03/10/22   Allwardt, Alyssa M, PA-C  benzonatate (TESSALON PERLES) 100 MG capsule Take 1 capsule (100 mg total) by mouth 3 (three) times daily as needed for cough. 04/08/22   Tawnya Crook, MD  Cholecalciferol 25 MCG (1000 UT) capsule Take 1,000 Units by mouth daily.    [provider]  Astrid Drafts 1000 MG CAPS daily.    [provider]  MAGNESIUM PO magnesium    [provider]   rosuvastatin (CRESTOR) 10 MG tablet TAKE 1 TABLET(10 MG) BY MOUTH DAILY 05/27/22   Marin Olp, MD  vitamin B-12 (CYANOCOBALAMIN) 1000 MCG tablet Take 1,000 mcg by mouth daily.    [provider]  zolpidem (AMBIEN) 10 MG tablet Take 0.5-1 tablets (5-10 mg total) by mouth at bedtime as needed for sleep (try to limit to 5 mg most of the time). 01/06/22 05/06/22  Marin Olp, MD     Allergies    Hydrochlorothiazide    Review of Systems   Review of Systems  Respiratory:  Negative for shortness of breath.   Cardiovascular:  Positive for palpitations. Negative for chest pain and leg swelling.    Physical Exam Updated Vital Signs BP (!) 164/88 (BP Location: Left Arm)   Pulse 91   Temp 98.1 F (36.7 C) (Oral)   Resp 18   Ht '5\' 1"'$  (1.549 m)   Wt 81.6 kg   SpO2 98%   BMI 34.01 kg/m  Physical Exam Constitutional:      Appearance: Normal appearance. She is obese.  HENT:     Head: Normocephalic.  Cardiovascular:     Rate and Rhythm: Normal rate and regular rhythm.     Pulses: Normal pulses.     Heart sounds: Normal heart sounds. No murmur heard. Pulmonary:     Effort: Pulmonary  effort is normal. No respiratory distress.     Breath sounds: Normal breath sounds. No wheezing.  Abdominal:     General: Bowel sounds are normal.     Palpations: Abdomen is soft.  Musculoskeletal:     Comments: Normal gait  Skin:    General: Skin is warm and dry.  Neurological:     General: No focal deficit present.     Mental Status: She is alert. Mental status is at baseline.     ED Results / Procedures / Treatments   Labs (all labs ordered are listed, but only abnormal results are displayed) Labs Reviewed  BASIC METABOLIC PANEL - Abnormal; Notable for the following components:      Result Value   Glucose, Bld 154 (*)    All other components within normal limits  CBC    EKG EKG Interpretation  Date/Time:  Thursday June 12 2022 16:23:21 EDT Ventricular Rate:  86 PR  Interval:  148 QRS Duration: 82 QT Interval:  362 QTC Calculation: 433 R Axis:   33 Text Interpretation: Sinus rhythm Borderline T abnormalities, anterior leads No significant change since last tracing Confirmed by Wandra Arthurs [phone]) on 06/12/2022 4:44:22 PM  Radiology DG Chest 2 View  Result Date: 06/12/2022 CLINICAL DATA:  Atrial fibrillation, palpitations EXAM: CHEST - 2 VIEW COMPARISON:  04/17/2009 FINDINGS: Frontal and lateral views of the chest demonstrate an unremarkable cardiac silhouette. No airspace disease, effusion, or pneumothorax. No acute bony abnormalities. IMPRESSION: 1. No acute intrathoracic process. Electronically Signed   By: Randa Ngo M.D.   On: 06/12/2022 16:46    Procedures Procedures    Medications Ordered in ED Medications - No data to display  ED Course/ Medical Decision Making/ A&P                             Medical Decision Making 4:42 PM 72 y/o F without previous diagnosis of atrial fibrillation who presents due to episode of palpitations lasting ~10 minutes, max HR 143. Vitals notable for HR 91, hypertension 164/88 (reports hx of white coat syndrome). EKG NSR rate 86 bpm. ?if she could have pAF vs other intermittent arrhythmia (possible PVC). May benefit from zio patch outpatient.   4:51 PM CBC, BMP, CXR unremarkable. Updated patient on results. Reviewed her apple watch strip which showed p-waves and a PVC, does not appear to be A-fib. She feels at her baseline. Reassuring work up with normal HR and asymptomatic. She is stable for d/c, would recommend zio patch outpatient. Placing amb referral to cardiology.  Amount and/or Complexity of Data Reviewed Labs: ordered. Radiology: ordered.         Final Clinical Impression(s) / ED Diagnoses Final diagnoses:  Palpitations    Rx / DC Orders ED Discharge Orders          Ordered    Ambulatory referral to Cardiology        06/12/22 Dresser, Fitzhugh,  DO 06/12/22 1716    Drenda Freeze, MD 06/13/22 [phone]

## 2022-06-12 NOTE — Telephone Encounter (Signed)
Adrienne states final disposition is to have patient see within 3-4 hours. I informed her that we don't have anymore availabilities. Adrienne verbalized understanding and stated she would tell pt to go to UC/ED.

## 2022-06-12 NOTE — Discharge Instructions (Signed)
ER workup in the emergency department does not show signs of atrial fibrillation.  Your heart rate was normal.  Your blood work looks great.  Your EKG was in normal rhythm.  I would encourage you to follow-up with your primary care physician, they can order a Zio patch to assess for any abnormal heart rhythms.   Please return if you have chest pain and/or shortness of breath.

## 2022-06-13 NOTE — Telephone Encounter (Signed)
Pt was seen in ED [name] 06/12/22  Patient Name: Kathy [name] Gender: Female DOB: [birth year] Age: 72 Y 3 M 1 D Return Phone Number: [phone] (Primary), [phone] (Secondary) Address: City/ State/ Zip: [place]  [address] Client [facility] Client [facility] Provider Garret Reddish- MD Contact Type Call Who Is Calling Patient / Member / Family / Caregiver Call Type Triage / Clinical Relationship To Patient Self Return Phone Number [phone] (Primary) Chief Complaint Heart palpitations or irregular heartbeat Reason for Call Symptomatic / Request for [name] states her s/s AFIB attack that lasted 5 minutes, the lowest heart tare 95, the highest is 143. As of now no Sx. Translation No Nurse Assessment Nurse: D'Heur Lucia Gaskins, RN, Adrienne Date/Time (Eastern Time): 06/12/2022 3:36:33 PM Confirm and document reason for call. If symptomatic, describe symptoms. ---Caller states she is having an A-fib attack that lasted 5 minutes, the lowest heart rate was 95, the highest is 155. As of now, no chest pain or trouble breathing. She had another episode in December, 2022, that lasted 15 minutes. She is not [name] blood thinners. Does the patient have any new or worsening symptoms? ---Yes Will a triage be completed? ---Yes Related visit to physician within the last 2 weeks? ---No Does the PT have any chronic conditions? (i.e. diabetes, asthma, this includes High risk factors for pregnancy, etc.) ---Yes List chronic conditions. ---high cholesterol, infrequent episodes of A-fib, insomnia, anxiety Is this a behavioral health or substance abuse call? ---No Guidelines Guideline Title Affirmed Question Affirmed Notes Nurse Date/Time (Eastern Time) Heart Rate and Heartbeat Questions [1] Heart beating very rapidly (e.g., > 140 / minute) AND [2] not present now D'Heur  Lucia Gaskins, RN, Vincente Liberty 06/12/2022 3:41:34 PM Guidelines Guideline Title Affirmed Question Affirmed Notes Nurse Date/Time Eilene Ghazi Time) (Exception: During exercise.) Disp. Time Eilene Ghazi Time) Disposition Final User 06/12/2022 3:44:09 PM See HCP within 4 Hours (or PCP triage) Yes D'Heur Lucia Gaskins, RN, Adrienne Final Disposition 06/12/2022 3:44:09 PM See HCP within 4 Hours (or PCP triage) Yes D'Heur Lucia Gaskins, RN, Ebbie Latus Disagree/Comply Comply Caller Understands Yes PreDisposition Call Doctor Care Advice Given Per Guideline SEE HCP (OR PCP TRIAGE) WITHIN 4 HOURS: * IF OFFICE WILL BE OPEN: You need to be seen within the next 3 or 4 hours. Call your doctor (or NP/PA) now or as soon as the office opens. CALL BACK IF: * You become worse CARE ADVICE given per Heart Rate and Heartbeat Questions (Adult) guideline. Comments User: Vincente Liberty, D'Heur Lucia Gaskins, RN Date/Time Eilene Ghazi Time): 06/12/2022 3:50:29 PM Office unable to see patient within 4 hours, recommended UC. Patient is already in the car. Referrals REFERRED TO PCP OFFICE

## 2022-07-04 ENCOUNTER — Ambulatory Visit (INDEPENDENT_AMBULATORY_CARE_PROVIDER_SITE_OTHER): Payer: Medicare PPO | Admitting: Clinical

## 2022-07-04 DIAGNOSIS — F411 Generalized anxiety disorder: Secondary | ICD-10-CM | POA: Diagnosis not present

## 2022-07-04 NOTE — Progress Notes (Signed)
                Kathy Scorsone, LCSW 

## 2022-07-04 NOTE — Progress Notes (Signed)
Behavioral Health Counselor/Therapist Progress Note  Kathy [name] ID: Kathy [name], MRN: [id number],    Date: 07/04/2022  Time Spent: 12:34pm - 1:29pm : 55 minutes  Treatment Type: Individual Therapy  Reported Symptoms: Kathy [name] reported increased stress  Mental Status Exam: Appearance:  Well Groomed     Behavior: Appropriate  Motor: Normal  Speech/Language:  Clear and Coherent  Affect: Appropriate  Mood: normal  Thought process: normal  Thought content:   WNL  Sensory/Perceptual disturbances:   WNL  Orientation: oriented to person, place, and situation  Attention: Good  Concentration: Good  Memory: WNL  Fund of knowledge:  Good  Insight:   Good  Judgment:  Good  Impulse Control: Good   Risk Assessment: Danger to Self:  No Kathy [name] denied current suicidal ideation  Self-injurious Behavior: No Danger to Others: No Kathy [name] denied current homicidal ideation  Duty to Warn:no Physical Aggression / Violence:No  Access to Firearms a concern: No  Gang Involvement:No   Subjective: Kathy [name] reported she continues to experience stress related to building her new home and the projected move in date has been extended again. Kathy [name] reported her mother continues to ask Kathy [name] to visit her and watch television with her in the evenings. Kathy [name] reported her mother responds that she understands their conversations but is not able to retain the information. Kathy [name] reported her mother receives occupational therapy and Kathy [name] has asked that occupational therapist to work with her mother to increase her mother's activity level during the day. Kathy [name] reported concern about her mother's activity level and reported concern that her mother doesn't want to leave her room. Kathy [name] reported she has considered discussing her concerns with her mother's physician. Kathy [name] stated, "I've had a little more stress" and reported she has taken xanax a few times due to the impact of stressors.    Interventions: Cognitive Behavioral Therapy and Problem Solving.  Clinician conducted session via WebEx video from clinician's home office. Kathy [name] provided verbal consent to proceed with telehealth session and participated in session from Kathy [name]'s home. Reviewed events since last session, recent stressors, and the impact on Kathy [name]'s mental health. Provided psycho education related to symptoms/changes associated with Alzheimer's Disease and strategies to utilize in response, such as, talking with the activity director or the social worker in her mother's long term care community, and attending community functions with her mother. Provided active and reflective listening, provided validation.   Diagnosis:  Generalized anxiety disorder     Plan: Kathy [name] is to utilize [employer] Therapy, thought re-framing, relaxation techniques, and coping strategies to decrease symptoms associated with Generalized Anxiety Disorder. Frequency: bi-weekly  Modality: individual      Long-term goal:   Reduce overall level, frequency, and intensity of the feelings of anxiety as evidenced by decrease in feeling anxious, difficulty falling asleep, tearful when feeling anxious, irritability, changes in concentration, worry, tightness in chest, and feeling on edge from 7 days/week to 0 to 1 days/week per Kathy [name] report for at least 3 consecutive months. Target Date: 04/29/23  Progress: progressing    Short-term goal:  Verbally express an understanding of the relationship between feelings of anxiety and the impact on thinking patterns and behaviors.   Target Date: 10/27/22  Progress: progressing      Verbalize an understanding of the role that distorted thinking plays in creating excessive worry, feelings of guilt, and "feeling of dread"   Target Date: 10/27/22  Progress: progressing    Decrease daily level of anxiety by developing healthy coping mechanisms to  utilize in response   Target Date: 10/27/22   Progress: progressing               Doree Barthel, LCSW

## 2022-07-24 NOTE — Progress Notes (Signed)
Kathy Sales, MD Reason for referral-atrial fibrillation  HPI: 72 year old female for evaluation of atrial fibrillation at request of Chaney Malling, MD.  Calcium score November 2023 310 which was 84th percentile.  Patient seen with palpitations March 2024 in the emergency room.  She states that her Apple Watch told her that she may be in atrial fibrillation.  Hemoglobin 14.6, potassium 3.5, creatinine 0.57.  Chest x-ray without acute infiltrates.  Cardiology now asked to evaluate.  In the past 1 year patient has had 2 separate episodes of palpitations described as her heart racing which was sudden in onset.  No associated symptoms.  She did record rhythm strips with her Apple Watch which are available to review.  Her most recent episode showed atrial fibrillation with rapid ventricular response.  She otherwise denies dyspnea on exertion, orthopnea, PND, pedal edema, exertional chest pain or syncope.  No bleeding.  Current Outpatient Medications  Medication Sig Dispense Refill   ALPRAZolam (XANAX) 0.25 MG tablet Take 1 tablet (0.25 mg total) by mouth 2 (two) times daily as needed for anxiety (do not take for 8 hours after driving and do not take on nights you take ambien). 20 tablet 0   apixaban (ELIQUIS) 5 MG TABS tablet Take 1 tablet (5 mg total) by mouth 2 (two) times daily. 60 tablet 6   azelastine (ASTELIN) 0.1 % nasal spray Place 2 sprays into both nostrils 2 (two) times daily. Use in each nostril as directed 30 mL 1   benzonatate (TESSALON PERLES) 100 MG capsule Take 1 capsule (100 mg total) by mouth 3 (three) times daily as needed for cough. 20 capsule 0   Cholecalciferol 25 MCG (1000 UT) capsule Take 1,000 Units by mouth daily.     Krill Oil 1000 MG CAPS daily.     MAGNESIUM PO magnesium     metoprolol succinate (TOPROL XL) 25 MG 24 hr tablet Take 1 tablet (25 mg total) by mouth at bedtime. 90 tablet 3   vitamin B-12 (CYANOCOBALAMIN) 1000 MCG tablet Take 1,000 mcg by mouth daily.      rosuvastatin (CRESTOR) 20 MG tablet Take 1 tablet (20 mg total) by mouth daily. 90 tablet 3   zolpidem (AMBIEN) 10 MG tablet Take 0.5-1 tablets (5-10 mg total) by mouth at bedtime as needed for sleep (try to limit to 5 mg most of the time). 20 tablet 5   No current facility-administered medications for this visit.    Allergies  Allergen Reactions   Hydrochlorothiazide     Other reaction(s): low potassium     Past Medical History:  Diagnosis Date   GERD (gastroesophageal reflux disease) 07/02/2021   Sparing omeprazole . Was told hiatal hernia in past    History of heart murmur in childhood 07/02/2021   Hyperlipidemia, unspecified 07/02/2021   Not on meds    White coat syndrome without diagnosis of hypertension 07/02/2021   Diagnosed around age 32     Past Surgical History:  Procedure Laterality Date   CERVICAL SPINE SURGERY     CHOLECYSTECTOMY     COLONOSCOPY      Social History   Socioeconomic History   Marital status: Married    Spouse name: Not on file   Number of children: Not on file   Years of education: Not on file   Highest education level: Not on file  Occupational History   Not on file  Tobacco Use   Smoking status: Never   Smokeless tobacco: Never  Vaping  Use   Vaping Use: Never used  Substance and Sexual Activity   Alcohol use: Yes    Comment: twice a month  or up to once a week   Drug use: Never   Sexual activity: Not on file  Other Topics Concern   Not on file  Social History Narrative   Married. No siblings. 2 kids. 2 grandkids 7 grandson, almost 4 daughter in 2023.       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, reading but harder when caring for grandkids.    Social Determinants of Health   Financial Resource Strain: Low Risk  (10/08/2021)   Overall Financial Resource Strain (CARDIA)    Difficulty of Paying Living Expenses: Not hard at all  Food Insecurity:  No Food Insecurity (10/08/2021)   Hunger Vital Sign    Worried About Running Out of Food in the Last Year: Never true    Ran Out of Food in the Last Year: Never true  Transportation Needs: No Transportation Needs (10/08/2021)   PRAPARE - Administrator, Civil Service (Medical): No    Lack of Transportation (Non-Medical): No  Physical Activity: Inactive (10/08/2021)   Exercise Vital Sign    Days of Exercise per Week: 0 days    Minutes of Exercise per Session: 0 min  Stress: Stress Concern Present (10/08/2021)   [employer] of Occupational Health - Occupational Stress Questionnaire    Feeling of Stress : To some extent  Social Connections: Moderately Integrated (10/08/2021)   Social Connection and Isolation Panel [NHANES]    Frequency of Communication with Friends and Family: More than three times a week    Frequency of Social Gatherings with Friends and Family: Three times a week    Attends Religious Services: 1 to 4 times per year    Active Member of Clubs or Organizations: No    Attends Banker Meetings: Never    Marital Status: Married  Catering manager Violence: Not At Risk (10/08/2021)   Humiliation, Afraid, Rape, and Kick questionnaire    Fear of Current or Ex-Partner: No    Emotionally Abused: No    Physically Abused: No    Sexually Abused: No    Family History  Problem Relation Age of Onset   Atrial fibrillation Mother    Heart failure Mother    Hypertension Mother    Memory loss Mother    CAD Father        smoker died 23   Hyperlipidemia Father    Diabetes Father     ROS: no fevers or chills, productive cough, hemoptysis, dysphasia, odynophagia, melena, hematochezia, dysuria, hematuria, rash, seizure activity, orthopnea, PND, pedal edema, claudication. Remaining systems are negative.  Physical Exam:   Blood pressure 126/73, pulse 76, height 5' (1.524 m), weight 186 lb 0.6 oz (84.4 kg).  General:  Well developed/well nourished in  NAD Skin warm/dry Patient not depressed No peripheral clubbing Back-normal HEENT-normal/normal eyelids Neck supple/normal carotid upstroke bilaterally; no bruits; no JVD; no thyromegaly chest - CTA/ normal expansion CV - RRR/normal S1 and S2; no murmurs, rubs or gallops;  PMI nondisplaced Abdomen -NT/ND, no HSM, no mass, + bowel sounds, no bruit 2+ femoral pulses, no bruits Ext-no edema, chords, 2+ DP Neuro-grossly nonfocal  ECG -June 12, 2022-normal sinus rhythm with nonspecific ST changes.  Personally reviewed  A/P  1 paroxysmal atrial fibrillation-patient has  had 2 separate episodes of palpitations.  She brought a rhythm strip and there was note of atrial fibrillation associated.  Will begin Toprol 25 mg daily for rate control if atrial fibrillation recurs.  Will arrange echocardiogram to assess LV function.  Check TSH.  Embolic risk factors include female sex and age greater than 75.  CHA2DS2-VASc is 2.  Begin apixaban 5 mg twice daily.  If she has more frequent episodes in the future we will consider addition of antiarrhythmic versus ablation.  2 coronary calcification-noted on previous CT scan.  Discontinue aspirin given need for apixaban.  Continue statin.  3 hyperlipidemia-given documented coronary calcification will increase Crestor to 20 mg daily.  Check lipids and liver in 8 weeks.  Goal LDL 55.  Olga Millers, MD

## 2022-07-30 ENCOUNTER — Ambulatory Visit: Payer: Medicare PPO | Attending: Cardiology | Admitting: Cardiology

## 2022-07-30 ENCOUNTER — Encounter: Payer: Self-pay | Admitting: Cardiology

## 2022-07-30 VITALS — BP 126/73 | HR 76 | Ht 60.0 in | Wt 186.0 lb

## 2022-07-30 DIAGNOSIS — I480 Paroxysmal atrial fibrillation: Secondary | ICD-10-CM | POA: Diagnosis not present

## 2022-07-30 DIAGNOSIS — E785 Hyperlipidemia, unspecified: Secondary | ICD-10-CM

## 2022-07-30 DIAGNOSIS — I4891 Unspecified atrial fibrillation: Secondary | ICD-10-CM | POA: Insufficient documentation

## 2022-07-30 MED ORDER — METOPROLOL SUCCINATE ER 25 MG PO TB24: 25.0000 mg | ORAL_TABLET | Freq: Every day | ORAL | 3 refills | Status: DC

## 2022-07-30 MED ORDER — APIXABAN 5 MG PO TABS: 5.0000 mg | ORAL_TABLET | Freq: Two times a day (BID) | ORAL | 6 refills | Status: DC

## 2022-07-30 MED ORDER — ROSUVASTATIN CALCIUM 20 MG PO TABS: 20.0000 mg | ORAL_TABLET | Freq: Every day | ORAL | 3 refills | Status: DC

## 2022-07-30 NOTE — Patient Instructions (Signed)
Medication Instructions:   INCREASING ROSUVASTATIN TO 20 MG ONCE DAILY= 2 OF THE 10 MG TABLETS ONCE DAILY  STARTING METOPROLOL SUCC ER 25 MG ONCE DAILY AT BEDTIME  START ELIQUIS 5 MG ONE TABLET TWICE DAILY  STOP ASPIRIN  *If you need a refill on your cardiac medications before your next appointment, please call your pharmacy*   Lab Work:  Your physician recommends that you return for lab work in: [facility]  [employer]  Located on the 3 rd floor in [address] Hours-Monday - Friday 8 am-12 noon and 1 pm -4 pm   If you have labs (blood work) drawn today and your tests are completely normal, you will receive your results only by: MyChart Message (if you have MyChart) OR A paper copy in the mail If you have any lab test that is abnormal or we need to change your treatment, we will call you to review the results.   Testing/Procedures:  Your physician has requested that you have an echocardiogram. Echocardiography is a painless test that uses sound waves to create images of your heart. It provides your doctor with information about the size and shape of your heart and how well your heart's chambers and valves are working. This procedure takes approximately one hour. There are no restrictions for this procedure. Please do NOT wear cologne, perfume, aftershave, or lotions (deodorant is allowed). Please arrive 15 minutes prior to your appointment time. [facility] [place] OFF[facility]=1ST FLOOR IMAGING DEPARTMENT    Follow-Up: At [facility], you and your health needs are our priority.  As part of our continuing mission to provide you with exceptional heart care, we have created designated Provider Care Teams.  These Care Teams include your primary Cardiologist (physician) and Advanced Practice Providers (APPs -  Physician Assistants and Nurse Practitioners) who all work together to provide you with the care you need, when you need it.  We recommend signing up for the patient  portal called "MyChart".  Sign up information is provided on this After Visit Summary.  MyChart is used to connect with patients for Virtual Visits (Telemedicine).  Patients are able to view lab/test results, encounter notes, upcoming appointments, etc.  Non-urgent messages can be sent to your provider as well.   To learn more about what you can do with MyChart, go to [email or web address].    Your next appointment:   4 month(s)  Provider:   Olga Millers, MD

## 2022-08-08 ENCOUNTER — Ambulatory Visit: Payer: Medicare PPO | Admitting: Clinical

## 2022-08-21 ENCOUNTER — Ambulatory Visit (HOSPITAL_BASED_OUTPATIENT_CLINIC_OR_DEPARTMENT_OTHER)
Admission: RE | Admit: 2022-08-21 | Discharge: 2022-08-21 | Disposition: A | Discharge status: Home / Self Care | Payer: Medicare PPO | Source: Ambulatory Visit | Attending: Cardiology | Admitting: Cardiology

## 2022-08-21 DIAGNOSIS — I480 Paroxysmal atrial fibrillation: Secondary | ICD-10-CM | POA: Diagnosis not present

## 2022-08-21 LAB — ECHOCARDIOGRAM COMPLETE
AR max vel: 1.23 cm2
AV Area VTI: 1.4 cm2
AV Area mean vel: 1.11 cm2
AV Mean grad: 5 mmHg
AV Peak grad: 9.4 mmHg
Ao pk vel: 1.53 m/s
Area-P 1/2: 3.13 cm2
P 1/2 time: 483 msec
S' Lateral: 2.8 cm

## 2022-10-07 DIAGNOSIS — I480 Paroxysmal atrial fibrillation: Secondary | ICD-10-CM | POA: Diagnosis not present

## 2022-10-07 DIAGNOSIS — E785 Hyperlipidemia, unspecified: Secondary | ICD-10-CM | POA: Diagnosis not present

## 2022-10-08 DIAGNOSIS — H5213 Myopia, bilateral: Secondary | ICD-10-CM | POA: Diagnosis not present

## 2022-10-08 LAB — TSH: TSH: 1.66 u[IU]/mL (ref 0.450–4.500)

## 2022-10-08 LAB — LIPID PANEL
Chol/HDL Ratio: 2.6 ratio (ref 0.0–4.4)
Cholesterol, Total: 148 mg/dL (ref 100–199)
HDL: 57 mg/dL (ref >39)
LDL Chol Calc (NIH): 69 mg/dL (ref 0–99)
Triglycerides: 127 mg/dL (ref 0–149)
VLDL Cholesterol Cal: 22 mg/dL (ref 5–40)

## 2022-10-08 LAB — HEPATIC FUNCTION PANEL
ALT: 27 IU/L (ref 0–32)
AST: 26 IU/L (ref 0–40)
Albumin: 4.6 g/dL (ref 3.9–4.9)
Alkaline Phosphatase: 75 IU/L (ref 44–121)
Bilirubin Total: 0.5 mg/dL (ref 0.0–1.2)
Bilirubin, Direct: 0.17 mg/dL (ref 0.00–0.40)
Total Protein: 7.3 g/dL (ref 6.0–8.5)

## 2022-10-20 ENCOUNTER — Ambulatory Visit: Payer: Medicare PPO

## 2022-10-20 VITALS — Wt 186.0 lb

## 2022-10-20 DIAGNOSIS — Z0000 Encounter for general adult medical examination without abnormal findings: Secondary | ICD-10-CM

## 2022-10-20 NOTE — Progress Notes (Addendum)
Subjective:   Kathy [name] is a 72 y.o. female who presents for Medicare Annual (Subsequent) preventive examination.  Per patient no change in vitals since last visit, unable to obtain new vitals due to telehealth visit   Visit Complete: Virtual  I connected with  Aurelio Brash on 10/20/22 by a audio enabled telemedicine application and verified that I am speaking with the correct person using two identifiers.  Patient Location: Home   Provider Location: Office/Clinic  I discussed the limitations of evaluation and management by telemedicine. The patient expressed understanding and agreed to proceed.  Patient Medicare AWV questionnaire was completed by the patient on 10/20/22; I have confirmed that all information answered by patient is correct and no changes since this date.  Review of Systems     Cardiac Risk Factors include: advanced age (>69men, >30 women);dyslipidemia;hypertension;obesity (BMI >30kg/m2)     Objective:    Today's Vitals   10/20/22 0922  Weight: 186 lb (84.4 kg)   Body mass index is 36.33 kg/m.     10/20/2022    9:30 AM 06/12/2022    4:19 PM 10/08/2021    9:41 AM 03/16/2021    4:01 PM  Advanced Directives  Does Patient Have a Medical Advance Directive? Yes No Yes Yes  Type of Estate agent of [place];Living will  Healthcare Power of [place];Living will   Copy of Healthcare Power of Attorney in Chart? No - copy requested  No - copy requested   Would patient like information on creating a medical advance directive?  No - Patient declined      Current Medications (verified) Outpatient Encounter Medications as of 10/20/2022  Medication Sig   ALPRAZolam (XANAX) 0.25 MG tablet Take 1 tablet (0.25 mg total) by mouth 2 (two) times daily as needed for anxiety (do not take for 8 hours after driving and do not take on nights you take ambien).   apixaban (ELIQUIS) 5 MG TABS tablet Take 1 tablet (5 mg total) by mouth 2 (two) times daily.    azelastine (ASTELIN) 0.1 % nasal spray Place 2 sprays into both nostrils 2 (two) times daily. Use in each nostril as directed (Patient taking differently: Place 2 sprays into both nostrils as needed for allergies. Use in each nostril as directed)   Cholecalciferol 25 MCG (1000 UT) capsule Take 1,000 Units by mouth daily.   Krill Oil 1000 MG CAPS daily.   MAGNESIUM PO magnesium   metoprolol succinate (TOPROL XL) 25 MG 24 hr tablet Take 1 tablet (25 mg total) by mouth at bedtime.   rosuvastatin (CRESTOR) 20 MG tablet Take 1 tablet (20 mg total) by mouth daily.   vitamin B-12 (CYANOCOBALAMIN) 1000 MCG tablet Take 1,000 mcg by mouth daily.   zolpidem (AMBIEN) 10 MG tablet Take 0.5-1 tablets (5-10 mg total) by mouth at bedtime as needed for sleep (try to limit to 5 mg most of the time).   [DISCONTINUED] benzonatate (TESSALON PERLES) 100 MG capsule Take 1 capsule (100 mg total) by mouth 3 (three) times daily as needed for cough.   No facility-administered encounter medications on file as of 10/20/2022.    Allergies (verified) Hydrochlorothiazide   History: Past Medical History:  Diagnosis Date   GERD (gastroesophageal reflux disease) 07/02/2021   Sparing omeprazole . Was told hiatal hernia in past    History of heart murmur in childhood 07/02/2021   Hyperlipidemia, unspecified 07/02/2021   Not on meds    White coat syndrome without diagnosis of  hypertension 07/02/2021   Diagnosed around age 65    Past Surgical History:  Procedure Laterality Date   CERVICAL SPINE SURGERY     CHOLECYSTECTOMY     COLONOSCOPY     Family History  Problem Relation Age of Onset   Atrial fibrillation Mother    Heart failure Mother    Hypertension Mother    Memory loss Mother    CAD Father        smoker died 71   Hyperlipidemia Father    Diabetes Father    Social History   Socioeconomic History   Marital status: Married    Spouse name: Not on file   Number of children: Not on file   Years of education:  Not on file   Highest education level: Not on file  Occupational History   Not on file  Tobacco Use   Smoking status: Never   Smokeless tobacco: Never  Vaping Use   Vaping status: Never Used  Substance and Sexual Activity   Alcohol use: Yes    Comment: twice a month  or up to once a week   Drug use: Never   Sexual activity: Not on file  Other Topics Concern   Not on file  Social History Narrative   Married. No siblings. 2 kids. 2 grandkids 7 grandson, almost 4 daughter in 2023.       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, reading but harder when caring for grandkids.    Social Determinants of Health   Financial Resource Strain: Low Risk  (10/20/2022)   Overall Financial Resource Strain (CARDIA)    Difficulty of Paying Living Expenses: Not hard at all  Food Insecurity: No Food Insecurity (10/20/2022)   Hunger Vital Sign    Worried About Running Out of Food in the Last Year: Never true    Ran Out of Food in the Last Year: Never true  Transportation Needs: No Transportation Needs (10/20/2022)   PRAPARE - Administrator, Civil Service (Medical): No    Lack of Transportation (Non-Medical): No  Physical Activity: Insufficiently Active (10/20/2022)   Exercise Vital Sign    Days of Exercise per Week: 1 day    Minutes of Exercise per Session: 30 min  Stress: Stress Concern Present (10/20/2022)   [employer] of Occupational Health - Occupational Stress Questionnaire    Feeling of Stress : To some extent  Social Connections: Socially Integrated (10/20/2022)   Social Connection and Isolation Panel [NHANES]    Frequency of Communication with Friends and Family: More than three times a week    Frequency of Social Gatherings with Friends and Family: Patient declined    Attends Religious Services: 1 to 4 times per year    Active Member of [employer] or Organizations: Yes    Attends Museum/gallery exhibitions officer: More than 4 times per year    Marital Status: Married    Tobacco Counseling Counseling given: Not Answered   Clinical Intake:  Pre-visit preparation completed: Yes  Pain : No/denies pain     BMI - recorded: 36.33 Nutritional Status: BMI > 30  Obese Nutritional Risks: None Diabetes: No  How often do you need to have someone help you when you read instructions, pamphlets, or other written materials from your doctor or pharmacy?: 1 - Never  Interpreter Needed?: No  Information entered by :: Inetta Fermo  Daisja Kessinger, LPN   Activities of Daily Living    10/20/2022    8:58 AM  In your present state of health, do you have any difficulty performing the following activities:  Hearing? 1  Comment wears a hearing iads  Vision? 1  Comment wears glasses and readers  Difficulty concentrating or making decisions? 1  Comment at times  Walking or climbing stairs? 0  Dressing or bathing? 0  Doing errands, shopping? 0  Preparing Food and eating ? N  Using the Toilet? N  In the past six months, have you accidently leaked urine? Y  Comment wears a pad  Do you have problems with loss of bowel control? N  Managing your Medications? N  Managing your Finances? N  Housekeeping or managing your Housekeeping? N    Patient Care Team: Shelva Majestic, MD as PCP - General (Family Medicine)  Indicate any recent Medical Services you may have received from other than [facility] providers in the past year (date may be approximate).     Assessment:   This is a routine wellness examination for Tristy.  Hearing/Vision screen Hearing Screening - Comments:: Wears hearing aids  Vision Screening - Comments:: Pt follows up with Dr Fredrich Birks for annual eye exams   Dietary issues and exercise activities discussed:     Goals Addressed             This Visit's Progress    Patient Stated       Lose weight        Depression Screen    10/20/2022    9:28 AM 10/08/2021    9:36  AM 07/02/2021    1:02 PM  PHQ 2/9 Scores  PHQ - 2 Score 1 1 1   PHQ- 9 Score  2 4    Fall Risk    10/20/2022    8:58 AM 10/08/2021    9:42 AM  Fall Risk   Falls in the past year? 0 0  Number falls in past yr: 0 0  Injury with Fall? 0 0  Risk for fall due to : Impaired vision Impaired vision  Follow up Falls prevention discussed Falls prevention discussed    MEDICARE RISK AT HOME:   TIMED UP AND GO:  Was the test performed?  No    Cognitive Function:        10/20/2022    9:30 AM 10/08/2021    9:44 AM  6CIT Screen  What Year? 0 points 0 points  What month? 0 points 0 points  What time? 0 points 0 points  Count back from 20 0 points 0 points  Months in reverse 0 points 0 points  Repeat phrase 0 points 0 points  Total Score 0 points 0 points    Immunizations Immunization History  Administered Date(s) Administered   Fluad Quad(high Dose 65+) 02/08/2022   Influenza Split 02/14/2009, 01/27/2011, 01/31/2014, 12/27/2018   PFIZER Comirnaty(Gray Top)Covid-19 Tri-Sucrose Vaccine 01/03/2022   PFIZER(Purple Top)SARS-COV-2 Vaccination 05/06/2019, 05/31/2019, 01/02/2020, 08/12/2020   Pfizer Covid-19 Vaccine Bivalent Booster 57yrs & up 12/21/2020   Pneumococcal Conjugate-13 07/16/2017   Pneumococcal Polysaccharide-23 10/05/2019   Tdap 01/27/2011, 11/21/2020   Zoster, Live 10/27/2012    TDAP status: Up to date  Flu Vaccine status: Up to date  Pneumococcal vaccine status: Up to date  Covid-19 vaccine status: Completed vaccines  Qualifies for Shingles Vaccine? Yes   Zostavax completed No   Shingrix Completed?: No.    Education has been provided regarding the importance of  this vaccine. Patient has been advised to call insurance company to determine out of pocket expense if they have not yet received this vaccine. Advised may also receive vaccine at local pharmacy or Health Dept. Verbalized acceptance and understanding.  Screening Tests Health Maintenance  Topic Date Due    Colonoscopy  Never done   Zoster Vaccines- Shingrix (1 of 2) 03/12/2002   COVID-19 Vaccine (7 - 2023-24 season) 02/28/2022   INFLUENZA VACCINE  10/30/2022   MAMMOGRAM  04/23/2023   Medicare Annual Wellness (AWV)  10/20/2023   DTaP/Tdap/Td (3 - Td or Tdap) 11/22/2030   Pneumonia Vaccine 65+ Years old  Completed   DEXA SCAN  Completed   Hepatitis C Screening  Completed   HPV VACCINES  Aged Out    Health Maintenance  Health Maintenance Due  Topic Date Due   Colonoscopy  Never done   Zoster Vaccines- Shingrix (1 of 2) 03/12/2002   COVID-19 Vaccine (7 - 2023-24 season) 02/28/2022    Colonoscopy through [facility] will get scheduled   Mammogram status: Completed 04/22/21. Repeat every year  Bone Density status: Completed 03/09/18. Results reflect: Bone density results: NORMAL. Repeat every 2 years.   Additional Screening:  Hepatitis C Screening:  Completed 01/06/22  Vision Screening: Recommended annual ophthalmology exams for early detection of glaucoma and other disorders of the eye. Is the patient up to date with their annual eye exam?  Yes  Who is the provider or what is the name of the office in which the patient attends annual eye exams? Dr Fredrich Birks  If pt is not established with a provider, would they like to be referred to a provider to establish care? No .   Dental Screening: Recommended annual dental exams for proper oral hygiene   Community Resource Referral / Chronic Care Management: CRR required this visit?  No   CCM required this visit?  No     Plan:     I have personally reviewed and noted the following in the patient's chart:   Medical and social history Use of alcohol, tobacco or illicit drugs  Current medications and supplements including opioid prescriptions. Patient is not currently taking opioid prescriptions. Functional ability and status Nutritional status Physical activity Advanced directives List of other physicians Hospitalizations, surgeries,  and ER visits in previous 12 months Vitals Screenings to include cognitive, depression, and falls Referrals and appointments  In addition, I have reviewed and discussed with patient certain preventive protocols, quality metrics, and best practice recommendations. A written personalized care plan for preventive services as well as general preventive health recommendations were provided to patient.     [name], LPN   5/62/1308   After Visit Summary: (MyChart) Due to this being a telephonic visit, the after visit summary with patients personalized plan was offered to patient via MyChart   Nurse Notes: none

## 2022-10-20 NOTE — Patient Instructions (Signed)
Kathy [name] , Thank you for taking time to come for your Medicare Wellness Visit. I appreciate your ongoing commitment to your health goals. Please review the following plan we discussed and let me know if I can assist you in the future.   These are the goals we discussed:  Goals      Patient Stated     Lose weight         This is a list of the screening recommended for you and due dates:  Health Maintenance  Topic Date Due   Colon Cancer Screening  Never done   Zoster (Shingles) Vaccine (1 of 2) 03/12/2002   COVID-19 Vaccine (7 - 2023-24 season) 02/28/2022   Flu Shot  10/30/2022   Mammogram  04/23/2023   Medicare Annual Wellness Visit  10/20/2023   DTaP/Tdap/Td vaccine (3 - Td or Tdap) 11/22/2030   Pneumonia Vaccine  Completed   DEXA scan (bone density measurement)  Completed   Hepatitis C Screening  Completed   HPV Vaccine  Aged Out    Advanced directives: Please bring a copy of your health care power of attorney and living will to the office at your convenience.  Conditions/risks identified: lose weight   Next appointment: Follow up in one year for your annual wellness visit     Preventive Care 65 Years and Older, Female Preventive care refers to lifestyle choices and visits with your health care provider that can promote health and wellness. What does preventive care include? A yearly physical exam. This is also called an annual well check. Dental exams once or twice a year. Routine eye exams. Ask your health care provider how often you should have your eyes checked. Personal lifestyle choices, including: Daily care of your teeth and gums. Regular physical activity. Eating a healthy diet. Avoiding tobacco and drug use. Limiting alcohol use. Practicing safe sex. Taking low-dose aspirin every day. Taking vitamin and mineral supplements as recommended by your health care provider. What happens during an annual well check? The services and screenings done by your  health care provider during your annual well check will depend on your age, overall health, lifestyle risk factors, and family history of disease. Counseling  Your health care provider may ask you questions about your: Alcohol use. Tobacco use. Drug use. Emotional well-being. Home and relationship well-being. Sexual activity. Eating habits. History of falls. Memory and ability to understand (cognition). Work and work Astronomer. Reproductive health. Screening  You may have the following tests or measurements: Height, weight, and BMI. Blood pressure. Lipid and cholesterol levels. These may be checked every 5 years, or more frequently if you are over 18 years old. Skin check. Lung cancer screening. You may have this screening every year starting at age 38 if you have a 30-pack-year history of smoking and currently smoke or have quit within the past 15 years. Fecal occult blood test (FOBT) of the stool. You may have this test every year starting at age 35. Flexible sigmoidoscopy or colonoscopy. You may have a sigmoidoscopy every 5 years or a colonoscopy every 10 years starting at age 91. Hepatitis C blood test. Hepatitis B blood test. Sexually transmitted disease (STD) testing. Diabetes screening. This is done by checking your blood sugar (glucose) after you have not eaten for a while (fasting). You may have this done every 1-3 years. Bone density scan. This is done to screen for osteoporosis. You may have this done starting at age 15. Mammogram. This may be done every 1-2 years.  Talk to your health care provider about how often you should have regular mammograms. Talk with your health care provider about your test results, treatment options, and if necessary, the need for more tests. Vaccines  Your health care provider may recommend certain vaccines, such as: Influenza vaccine. This is recommended every year. Tetanus, diphtheria, and acellular pertussis (Tdap, Td) vaccine. You may  need a Td booster every 10 years. Zoster vaccine. You may need this after age 91. Pneumococcal 13-valent conjugate (PCV13) vaccine. One dose is recommended after age 33. Pneumococcal polysaccharide (PPSV23) vaccine. One dose is recommended after age 27. Talk to your health care provider about which screenings and vaccines you need and how often you need them. This information is not intended to replace advice given to you by your health care provider. Make sure you discuss any questions you have with your health care provider. Document Released: 04/13/2015 Document Revised: 12/05/2015 Document Reviewed: 01/16/2015 Elsevier Interactive Patient Education  2017 [employer].  Fall Prevention in the Home Falls can cause injuries. They can happen to people of all ages. There are many things you can do to make your home safe and to help prevent falls. What can I do on the outside of my home? Regularly fix the edges of walkways and driveways and fix any cracks. Remove anything that might make you trip as you walk through a door, such as a raised step or threshold. Trim any bushes or trees on the path to your home. Use bright outdoor lighting. Clear any walking paths of anything that might make someone trip, such as rocks or tools. Regularly check to see if handrails are loose or broken. Make sure that both sides of any steps have handrails. Any raised decks and porches should have guardrails on the edges. Have any leaves, snow, or ice cleared regularly. Use sand or salt on walking paths during winter. Clean up any spills in your garage right away. This includes oil or grease spills. What can I do in the bathroom? Use night lights. Install grab bars by the toilet and in the tub and shower. Do not use towel bars as grab bars. Use non-skid mats or decals in the tub or shower. If you need to sit down in the shower, use a plastic, non-slip stool. Keep the floor dry. Clean up any water that spills on  the floor as soon as it happens. Remove soap buildup in the tub or shower regularly. Attach bath mats securely with double-sided non-slip rug tape. Do not have throw rugs and other things on the floor that can make you trip. What can I do in the bedroom? Use night lights. Make sure that you have a light by your bed that is easy to reach. Do not use any sheets or blankets that are too big for your bed. They should not hang down onto the floor. Have a firm chair that has side arms. You can use this for support while you get dressed. Do not have throw rugs and other things on the floor that can make you trip. What can I do in the kitchen? Clean up any spills right away. Avoid walking on wet floors. Keep items that you use a lot in easy-to-reach places. If you need to reach something above you, use a strong step stool that has a grab bar. Keep electrical cords out of the way. Do not use floor polish or wax that makes floors slippery. If you must use wax, use non-skid floor wax.  Do not have throw rugs and other things on the floor that can make you trip. What can I do with my stairs? Do not leave any items on the stairs. Make sure that there are handrails on both sides of the stairs and use them. Fix handrails that are broken or loose. Make sure that handrails are as long as the stairways. Check any carpeting to make sure that it is firmly attached to the stairs. Fix any carpet that is loose or worn. Avoid having throw rugs at the top or bottom of the stairs. If you do have throw rugs, attach them to the floor with carpet tape. Make sure that you have a light switch at the top of the stairs and the bottom of the stairs. If you do not have them, ask someone to add them for you. What else can I do to help prevent falls? Wear shoes that: Do not have high heels. Have rubber bottoms. Are comfortable and fit you well. Are closed at the toe. Do not wear sandals. If you use a stepladder: Make sure  that it is fully opened. Do not climb a closed stepladder. Make sure that both sides of the stepladder are locked into place. Ask someone to hold it for you, if possible. Clearly mark and make sure that you can see: Any grab bars or handrails. First and last steps. Where the edge of each step is. Use tools that help you move around (mobility aids) if they are needed. These include: Canes. Walkers. Scooters. Crutches. Turn on the lights when you go into a dark area. Replace any light bulbs as soon as they burn out. Set up your furniture so you have a clear path. Avoid moving your furniture around. If any of your floors are uneven, fix them. If there are any pets around you, be aware of where they are. Review your medicines with your doctor. Some medicines can make you feel dizzy. This can increase your chance of falling. Ask your doctor what other things that you can do to help prevent falls. This information is not intended to replace advice given to you by your health care provider. Make sure you discuss any questions you have with your health care provider. Document Released: 01/11/2009 Document Revised: 08/23/2015 Document Reviewed: 04/21/2014 Elsevier Interactive Patient Education  2017 [employer].

## 2022-11-20 ENCOUNTER — Other Ambulatory Visit: Payer: Self-pay | Admitting: Family Medicine

## 2022-11-21 NOTE — Telephone Encounter (Signed)
Pt requesting refill for Zolpidem 10 mg tablet. Last OV 03/2022.

## 2022-11-24 NOTE — Progress Notes (Signed)
HPI: FU atrial fibrillation.  Calcium score November 2023 310 which was 84th percentile.  Patient seen with palpitations March 2024 in the emergency room.  She states that her Apple Watch told her that she may be in atrial fibrillation.  Echocardiogram May 2024 showed normal LV function, grade 1 diastolic dysfunction, mild left atrial enlargement, mild aortic insufficiency.  Since last seen the patient denies any dyspnea on exertion, orthopnea, PND, pedal edema, palpitations, syncope or chest pain.   Current Outpatient Medications  Medication Sig Dispense Refill   ALPRAZolam (XANAX) 0.25 MG tablet Take 1 tablet (0.25 mg total) by mouth 2 (two) times daily as needed for anxiety (do not take for 8 hours after driving and do not take on nights you take ambien). 20 tablet 0   apixaban (ELIQUIS) 5 MG TABS tablet Take 1 tablet (5 mg total) by mouth 2 (two) times daily. 60 tablet 6   azelastine (ASTELIN) 0.1 % nasal spray Place 2 sprays into both nostrils 2 (two) times daily. Use in each nostril as directed (Patient taking differently: Place 2 sprays into both nostrils as needed for allergies. Use in each nostril as directed) 30 mL 1   Cholecalciferol 25 MCG (1000 UT) capsule Take 1,000 Units by mouth daily.     Krill Oil 1000 MG CAPS daily.     MAGNESIUM PO magnesium     metoprolol succinate (TOPROL XL) 25 MG 24 hr tablet Take 1 tablet (25 mg total) by mouth at bedtime. 90 tablet 3   rosuvastatin (CRESTOR) 20 MG tablet Take 1 tablet (20 mg total) by mouth daily. 90 tablet 3   vitamin B-12 (CYANOCOBALAMIN) 1000 MCG tablet Take 1,000 mcg by mouth daily.     zolpidem (AMBIEN) 10 MG tablet Take 0.5-1 tablets (5-10 mg total) by mouth at bedtime as needed for sleep (try to limit to 5 mg most of the time. do not drive 8 hours after taking). 20 tablet 1   No current facility-administered medications for this visit.     Past Medical History:  Diagnosis Date   GERD (gastroesophageal reflux disease)  07/02/2021   Sparing omeprazole . Was told hiatal hernia in past    History of heart murmur in childhood 07/02/2021   Hyperlipidemia, unspecified 07/02/2021   Not on meds    White coat syndrome without diagnosis of hypertension 07/02/2021   Diagnosed around age 70     Past Surgical History:  Procedure Laterality Date   CERVICAL SPINE SURGERY     CHOLECYSTECTOMY     COLONOSCOPY      Social History   Socioeconomic History   Marital status: Married    Spouse name: Not on file   Number of children: Not on file   Years of education: Not on file   Highest education level: Not on file  Occupational History   Not on file  Tobacco Use   Smoking status: Never   Smokeless tobacco: Never  Vaping Use   Vaping status: Never Used  Substance and Sexual Activity   Alcohol use: Yes    Comment: twice a month  or up to once a week   Drug use: Never   Sexual activity: Not on file  Other Topics Concern   Not on file  Social History Narrative   Married. No siblings. 2 kids. 2 grandkids 7 grandson, almost 4 daughter in 2023.       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    Social Determinants of Health   Financial Resource Strain: Low Risk  (10/20/2022)   Overall Financial Resource Strain (CARDIA)    Difficulty of Paying Living Expenses: Not hard at all  Food Insecurity: No Food Insecurity (10/20/2022)   Hunger Vital Sign    Worried About Running Out of Food in the Last Year: Never true    Ran Out of Food in the Last Year: Never true  Transportation Needs: No Transportation Needs (10/20/2022)   PRAPARE - Administrator, Civil Service (Medical): No    Lack of Transportation (Non-Medical): No  Physical Activity: Insufficiently Active (10/20/2022)   Exercise Vital Sign    Days of Exercise per Week: 1 day    Minutes of Exercise per Session: 30 min   Stress: Stress Concern Present (10/20/2022)   [employer] of Occupational Health - Occupational Stress Questionnaire    Feeling of Stress : To some extent  Social Connections: Socially Integrated (10/20/2022)   Social Connection and Isolation Panel [NHANES]    Frequency of Communication with Friends and Family: More than three times a week    Frequency of Social Gatherings with Friends and Family: Patient declined    Attends Religious Services: 1 to 4 times per year    Active Member of [employer] or Organizations: Yes    Attends Engineer, structural: More than 4 times per year    Marital Status: Married  Catering manager Violence: Not At Risk (10/20/2022)   Humiliation, Afraid, Rape, and Kick questionnaire    Fear of Current or Ex-Partner: No    Emotionally Abused: No    Physically Abused: No    Sexually Abused: No    Family History  Problem Relation Age of Onset   Atrial fibrillation Mother    Heart failure Mother    Hypertension Mother    Memory loss Mother    CAD Father        smoker died 76   Hyperlipidemia Father    Diabetes Father     ROS: no fevers or chills, productive cough, hemoptysis, dysphasia, odynophagia, melena, hematochezia, dysuria, hematuria, rash, seizure activity, orthopnea, PND, pedal edema, claudication. Remaining systems are negative.  Physical Exam: Well-developed well-nourished in no acute distress.  Skin is warm and dry.  HEENT is normal.  Neck is supple.  Chest is clear to auscultation with normal expansion.  Cardiovascular exam is regular rate and rhythm.  Abdominal exam nontender or distended. No masses palpated. Extremities show no edema. neuro grossly intact   A/P  1 paroxysmal atrial fibrillation-patient had palpitations previously and rhythm strip associated with symptoms showed atrial fibrillation.  Continue Toprol and apixaban.  She has had no recurrences since last office visit.  Will consider referral for ablation versus  initiation of antiarrhythmic in the future if necessary.  2 coronary calcification-continue statin.  She denies chest pain.  3 hyperlipidemia-continue statin.  Olga Millers, MD

## 2022-12-03 ENCOUNTER — Ambulatory Visit: Payer: Medicare PPO | Attending: Cardiology | Admitting: Cardiology

## 2022-12-03 ENCOUNTER — Encounter: Payer: Self-pay | Admitting: Cardiology

## 2022-12-03 VITALS — BP 144/80 | HR 65 | Ht 60.0 in | Wt 186.8 lb

## 2022-12-03 DIAGNOSIS — E785 Hyperlipidemia, unspecified: Secondary | ICD-10-CM | POA: Diagnosis not present

## 2022-12-03 DIAGNOSIS — I480 Paroxysmal atrial fibrillation: Secondary | ICD-10-CM

## 2022-12-03 NOTE — Patient Instructions (Signed)
 Medication Instructions:   Your physician recommends that you continue on your current medications as directed. Please refer to the Current Medication list given to you today.   *If you need a refill on your cardiac medications before your next appointment, please call your pharmacy*   Lab Work:  None ordered.  If you have labs (blood work) drawn today and your tests are completely normal, you will receive your results only by: MyChart Message (if you have MyChart) OR A paper copy in the mail If you have any lab test that is abnormal or we need to change your treatment, we will call you to review the results.   Testing/Procedures:  None ordered.    Follow-Up: At [facility], you and your health needs are our priority.  As part of our continuing mission to provide you with exceptional heart care, we have created designated Provider Care Teams.  These Care Teams include your primary Cardiologist (physician) and Advanced Practice Providers (APPs -  Physician Assistants and Nurse Practitioners) who all work together to provide you with the care you need, when you need it.  We recommend signing up for the patient portal called "MyChart".  Sign up information is provided on this After Visit Summary.  MyChart is used to connect with patients for Virtual Visits (Telemedicine).  Patients are able to view lab/test results, encounter notes, upcoming appointments, etc.  Non-urgent messages can be sent to your provider as well.   To learn more about what you can do with MyChart, go to [email or web address].    Your next appointment:   1 year(s)  Provider:   Olga Millers, MD    Other Instructions  Your physician wants you to follow-up in: 1 year.  You will receive a reminder letter in the mail two months in advance. If you don't receive a letter, please call our office to schedule the follow-up appointment.

## 2023-01-08 ENCOUNTER — Encounter: Payer: Medicare PPO | Admitting: Family Medicine

## 2023-01-26 ENCOUNTER — Other Ambulatory Visit: Payer: Self-pay | Admitting: Family Medicine

## 2023-01-26 DIAGNOSIS — Z1231 Encounter for screening mammogram for malignant neoplasm of breast: Secondary | ICD-10-CM

## 2023-02-02 ENCOUNTER — Ambulatory Visit (INDEPENDENT_AMBULATORY_CARE_PROVIDER_SITE_OTHER): Payer: Medicare PPO | Admitting: Family Medicine

## 2023-02-02 ENCOUNTER — Encounter: Payer: Self-pay | Admitting: Family Medicine

## 2023-02-02 VITALS — BP 146/70 | HR 68 | Temp 98.7°F | Ht 60.0 in | Wt 187.8 lb

## 2023-02-02 DIAGNOSIS — Z0000 Encounter for general adult medical examination without abnormal findings: Secondary | ICD-10-CM | POA: Diagnosis not present

## 2023-02-02 DIAGNOSIS — E669 Obesity, unspecified: Secondary | ICD-10-CM

## 2023-02-02 DIAGNOSIS — E785 Hyperlipidemia, unspecified: Secondary | ICD-10-CM

## 2023-02-02 DIAGNOSIS — Z131 Encounter for screening for diabetes mellitus: Secondary | ICD-10-CM | POA: Diagnosis not present

## 2023-02-02 DIAGNOSIS — Z23 Encounter for immunization: Secondary | ICD-10-CM

## 2023-02-02 LAB — CBC WITH DIFFERENTIAL/PLATELET
Basophils Absolute: 0 10*3/uL (ref 0.0–0.1)
Basophils Relative: 0.5 % (ref 0.0–3.0)
Eosinophils Absolute: 0.2 10*3/uL (ref 0.0–0.7)
Eosinophils Relative: 2.6 % (ref 0.0–5.0)
HCT: 43.5 % (ref 36.0–46.0)
Hemoglobin: 14.2 g/dL (ref 12.0–15.0)
Lymphocytes Relative: 34.9 % (ref 12.0–46.0)
Lymphs Abs: 2.6 10*3/uL (ref 0.7–4.0)
MCHC: 32.7 g/dL (ref 30.0–36.0)
MCV: 93.9 fL (ref 78.0–100.0)
Monocytes Absolute: 0.6 10*3/uL (ref 0.1–1.0)
Monocytes Relative: 8.6 % (ref 3.0–12.0)
Neutro Abs: 3.9 10*3/uL (ref 1.4–7.7)
Neutrophils Relative %: 53.4 % (ref 43.0–77.0)
Platelets: 227 10*3/uL (ref 150.0–400.0)
RBC: 4.63 Mil/uL (ref 3.87–5.11)
RDW: 12.6 % (ref 11.5–15.5)
WBC: 7.3 10*3/uL (ref 4.0–10.5)

## 2023-02-02 LAB — COMPREHENSIVE METABOLIC PANEL
ALT: 20 U/L (ref 0–35)
AST: 21 U/L (ref 0–37)
Albumin: 4.4 g/dL (ref 3.5–5.2)
Alkaline Phosphatase: 75 U/L (ref 39–117)
BUN: 17 mg/dL (ref 6–23)
CO2: 25 meq/L (ref 19–32)
Calcium: 9.7 mg/dL (ref 8.4–10.5)
Chloride: 106 meq/L (ref 96–112)
Creatinine, Ser: 0.68 mg/dL (ref 0.40–1.20)
GFR: 87.95 mL/min (ref >60.00)
Glucose, Bld: 107 mg/dL — ABNORMAL HIGH (ref 70–99)
Potassium: 4 meq/L (ref 3.5–5.1)
Sodium: 139 meq/L (ref 135–145)
Total Bilirubin: 0.5 mg/dL (ref 0.2–1.2)
Total Protein: 7.7 g/dL (ref 6.0–8.3)

## 2023-02-02 LAB — HEMOGLOBIN A1C: Hgb A1c MFr Bld: 5.9 % (ref 4.6–6.5)

## 2023-02-02 MED ORDER — ALPRAZOLAM 0.25 MG PO TABS: 0.2500 mg | ORAL_TABLET | Freq: Two times a day (BID) | ORAL | 0 refills | Status: AC | PRN

## 2023-02-02 NOTE — Patient Instructions (Addendum)
Let us know  when you decide to get your [facility] vaccine at the pharmacy.  Try to get your exercise restarted  Your blood pressure is higher than I would like in office. I would like for you to buy/use a home cuff to check at least 4x a week. Your goal is <135/85.  Update me in 2-3 weeks by mychart.  - restarting exercise may help as well   Please stop by lab before you go If you have mychart- we will send your results within 3 business days of [name] receiving them.  If you do not have mychart- we will call you about results within 5 business days of [name] receiving them.  *please also note that you will see labs on mychart as soon as they post. I will later go in and write notes on them- will say "notes from Dr. Durene Cal"   Please check with your pharmacy to see if they have the shingrix vaccine. If they do- please get this immunization and update [name] by phone call or mychart with dates you receive the vaccine  Recommended follow up: Return in about 1 year (around 02/02/2024) for physical or sooner if needed.Schedule b4 you leave.

## 2023-02-02 NOTE — Progress Notes (Signed)
Phone [phone]   Subjective:  Patient presents today for their annual physical. Chief complaint-noted.   See problem oriented charting- ROS- full  review of systems was completed and negative Per full ROS sheet completed by patient  The following were reviewed and entered/updated in epic: Past Medical History:  Diagnosis Date   GERD (gastroesophageal reflux disease) 07/02/2021   Sparing omeprazole . Was told hiatal hernia in past    History of heart murmur in childhood 07/02/2021   Hyperlipidemia, unspecified 07/02/2021   Not on meds    White coat syndrome without diagnosis of hypertension 07/02/2021   Diagnosed around age 5    Patient Active Problem List   Diagnosis Date Noted   Hyperlipidemia, unspecified 07/02/2021    Priority: Medium    Anxiety 11/29/2017    Priority: Medium    Caregiver burden 07/02/2021    Priority: Low   White coat syndrome without diagnosis of hypertension 07/02/2021    Priority: Low   GERD (gastroesophageal reflux disease) 07/02/2021    Priority: Low   History of heart murmur in childhood 07/02/2021    Priority: Low   A-fib (HCC) 07/30/2022   Insomnia 03/10/2022   Past Surgical History:  Procedure Laterality Date   CERVICAL SPINE SURGERY     CHOLECYSTECTOMY     COLONOSCOPY      Family History  Problem Relation Age of Onset   Atrial fibrillation Mother    Heart failure Mother    Hypertension Mother    Memory loss Mother    CAD Father        smoker died 81   Hyperlipidemia Father    Diabetes Father     Medications- reviewed and updated Current Outpatient Medications  Medication Sig Dispense Refill   apixaban (ELIQUIS) 5 MG TABS tablet Take 1 tablet (5 mg total) by mouth 2 (two) times daily. 60 tablet 6   azelastine (ASTELIN) 0.1 % nasal spray Place 2 sprays into both nostrils 2 (two) times daily. Use in each nostril as directed (Patient taking differently: Place 2 sprays into both nostrils as needed for allergies. Use in each nostril  as directed) 30 mL 1   Cholecalciferol 25 MCG (1000 UT) capsule Take 1,000 Units by mouth daily.     Krill Oil 1000 MG CAPS daily.     MAGNESIUM PO magnesium     metoprolol succinate (TOPROL XL) 25 MG 24 hr tablet Take 1 tablet (25 mg total) by mouth at bedtime. 90 tablet 3   rosuvastatin (CRESTOR) 20 MG tablet Take 1 tablet (20 mg total) by mouth daily. 90 tablet 3   vitamin B-12 (CYANOCOBALAMIN) 1000 MCG tablet Take 1,000 mcg by mouth daily.     zolpidem (AMBIEN) 10 MG tablet Take 0.5-1 tablets (5-10 mg total) by mouth at bedtime as needed for sleep (try to limit to 5 mg most of the time. do not drive 8 hours after taking). 20 tablet 1   ALPRAZolam (XANAX) 0.25 MG tablet Take 1 tablet (0.25 mg total) by mouth 2 (two) times daily as needed for anxiety (do not take for 8 hours after driving and do not take on nights you take ambien). 20 tablet 0   No current facility-administered medications for this visit.    Allergies-reviewed and updated Allergies  Allergen Reactions   Hydrochlorothiazide     Other reaction(s): low potassium    Social History   Social History Narrative   Married. No siblings. 2 kids. 2 grandkids 7 grandson, almost 4 daughter  in 2023.       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, reading but harder when caring for grandkids.    Objective  Objective:  BP (!) 146/70 Comment: no improvement on repeat  Pulse 68   Temp 98.7 F (37.1 C)   Ht 5' (1.524 m)   Wt 187 lb 12.8 oz (85.2 kg)   SpO2 98%   BMI 36.68 kg/m  Gen: NAD, resting comfortably HEENT: Mucous membranes are moist. Oropharynx normal Neck: no thyromegaly CV: RRR no murmurs rubs or gallops Lungs: CTAB no crackles, wheeze, rhonchi Abdomen: soft/nontender/nondistended/normal bowel sounds. No rebound or guarding.  Ext: no edema Skin: warm, dry Neuro: grossly normal, moves all extremities, PERRLA   Assessment and  Plan   72 y.o. female presenting for annual physical.  Health Maintenance counseling: 1. Anticipatory guidance: Patient counseled regarding regular dental exams -q6 months, eye exams - yearly,  avoiding smoking and second hand smoke , limiting alcohol to 1 beverage per day-primarily social at beach or on weekends- sparing , no illicit drugs .   2. Risk factor reduction:  Advised patient of need for regular exercise and diet rich and fruits and vegetables to reduce risk of heart attack and stroke.  Exercise-last year walking a mile most days of the week and doing free weights twice a week- has dropped off since moving- encouraged of restart- also good for stress management.  Diet/weight management-last year was feeling stuck with weight watchers-today reports interested in injections but too costly.  Wt Readings from Last 3 Encounters:  02/02/23 187 lb 12.8 oz (85.2 kg)  12/03/22 186 lb 12.8 oz (84.7 kg)  10/20/22 186 lb (84.4 kg)  3. Immunizations/screenings/ancillary studies-consider Shingrix at pharmacy, up-to-date on COVID shot and flu shot  Immunization History  Administered Date(s) Administered   Fluad Quad(high Dose 65+) 02/08/2022   Fluad Trivalent(High Dose 65+) 02/02/2023   Influenza Split 02/14/2009, 01/27/2011, 01/31/2014, 12/27/2018   PFIZER Comirnaty(Gray Top)Covid-19 Tri-Sucrose Vaccine 01/03/2022   PFIZER(Purple Top)SARS-COV-2 Vaccination 05/06/2019, 05/31/2019, 01/02/2020, 08/12/2020   Pfizer Covid-19 Vaccine Bivalent Booster 10yrs & up 12/21/2020   Pfizer(Comirnaty)Fall Seasonal Vaccine 12 years and older 01/29/2023   Pneumococcal Conjugate-13 07/16/2017   Pneumococcal Polysaccharide-23 10/05/2019   Tdap 01/27/2011, 11/21/2020   Zoster, Live 10/27/2012  4. Cervical cancer screening- GYN retired. past age based screening recommendations-denies history of abnormal exam 5. Breast cancer screening-  breast exam - self exams- and mammogram - scheduled 02/20/23  6. Colon cancer  screening - none on file- Reports completed 12/01/2012-just got letter- plans to do first of the year and will have them send [name] a copy  7. Skin cancer screening- Dr. Nita Sells.  advised regular sunscreen use. Denies worrisome, changing, or new skin lesions.  8. Birth control/STD check- postmenopausal only active with husband 9. Osteoporosis screening at 65- 03/09/2018 normal-offered repeat and wants to hold off just for now- rediscuss next visit  10. Smoking associated screening - never smoker  Status of chronic or acute concerns   # Caregiver burden-caring for 72 year old mother-referred to psychology last year for help with stress of this.  Thankfully recovered from double pneumonia   -some less stress as caring for grandkids less -did move into new home but working on selling old one  #hyperlipidemia S: Medication:rosuvastatin 20 mg daily, Krill oil, prefers aspirin 81 mg for primary prevention-controlled in 2024 with Boris Lown alone -Father  did have CAD age 67 Lab Results  Component Value Date   CHOL 148 10/07/2022   HDL 57 10/07/2022   LDLCALC 69 10/07/2022   TRIG 127 10/07/2022   CHOLHDL 2.6 10/07/2022   A/P: has been doing well- lipids up to date- continue current medications   # Anxiety/insomnia S: Medication: Ambien 5- 10 mg prescribed but if recommended 5 mg max (aware of potential memory loss/dementia risks and fall risk), alprazolam 0.25 mg sparingly for anxiety perhaps twice a week-last fill over a year ago at this point though -Stressors include caregiving as above  -We attempted to add therapy in 2024- did some sessions  -she's trying to use less Ambien if she can A/P: overall stable or improving- continue to monitor - may add therapy back in  -phq9 slightly high today-therapy likely will help- not at point wants to be on daily medicine - though we discussed  # [place] elevated blood pressure-Your blood pressure is higher than I would like in office. I would like for you  to buy/use a home cuff to check at least 4x a week. Your goal is <135/85.  Update me in 2-3 weeks by mychart.  -no recent home checks so she agrees to do so   #tripped over stool at beach she didn't see and then feet got tangled in laundry room baskets at home- discussed fall prevention strategies. No significant injuries with either     Recommended follow up: Return in about 1 year (around 02/02/2024) for physical or sooner if needed.Schedule b4 you leave. Future Appointments  Date Time Provider Department Center  02/20/2023  4:10 PM [facility] MM 2 [facility]  10/26/2023  8:45 AM [facility] ANNUAL WELLNESS VISIT 1 [facility]   Lab/Order associations: fasting   ICD-10-CM   1. Preventative health care  Z00.00     2. Need for influenza vaccination  Z23 Flu Vaccine Trivalent High Dose (Fluad)    3. Hyperlipidemia, unspecified hyperlipidemia type  E78.5 Comprehensive metabolic panel    CBC with Differential/Platelet    4. Screening for diabetes mellitus  Z13.1 Hemoglobin A1c    5. Obesity (BMI 30-39.9)  E66.9 Hemoglobin A1c      Meds ordered this encounter  Medications   ALPRAZolam (XANAX) 0.25 MG tablet    Sig: Take 1 tablet (0.25 mg total) by mouth 2 (two) times daily as needed for anxiety (do not take for 8 hours after driving and do not take on nights you take ambien).    Dispense:  20 tablet    Refill:  0    Return precautions advised.  Tana Conch, MD

## 2023-02-15 ENCOUNTER — Other Ambulatory Visit: Payer: Self-pay | Admitting: Cardiology

## 2023-02-16 NOTE — Telephone Encounter (Signed)
Prescription refill request for Eliquis received. Indication:afib Last office visit:9/24 Scr:0.68  11/24 Age: 72 Weight:85.2  kg  Prescription refilled

## 2023-02-20 ENCOUNTER — Ambulatory Visit
Admission: RE | Admit: 2023-02-20 | Discharge: 2023-02-20 | Disposition: A | Discharge status: Home / Self Care | Payer: Medicare PPO | Source: Ambulatory Visit | Attending: Family Medicine | Admitting: Family Medicine

## 2023-02-20 DIAGNOSIS — Z1231 Encounter for screening mammogram for malignant neoplasm of breast: Secondary | ICD-10-CM

## 2023-03-12 DIAGNOSIS — M79642 Pain in left hand: Secondary | ICD-10-CM | POA: Diagnosis not present

## 2023-06-17 DIAGNOSIS — M654 Radial styloid tenosynovitis [de Quervain]: Secondary | ICD-10-CM | POA: Diagnosis not present

## 2023-07-16 ENCOUNTER — Other Ambulatory Visit: Payer: Self-pay | Admitting: Cardiology

## 2023-07-23 ENCOUNTER — Other Ambulatory Visit: Payer: Self-pay | Admitting: *Deleted

## 2023-07-23 DIAGNOSIS — I4891 Unspecified atrial fibrillation: Secondary | ICD-10-CM

## 2023-07-23 MED ORDER — APIXABAN 5 MG PO TABS: 5.0000 mg | ORAL_TABLET | Freq: Two times a day (BID) | ORAL | 1 refills | Status: DC

## 2023-07-23 NOTE — Telephone Encounter (Signed)
 Eliquis  5mg  refill request received. Patient is 72 years old, weight-85.2kg, Crea-0.68 on 02/02/23, Diagnosis-Afib, and last seen by Dr. Audery Blazing on 12/03/22. Dose is appropriate based on dosing criteria. Will send in refill to requested pharmacy.

## 2023-10-18 ENCOUNTER — Other Ambulatory Visit: Payer: Self-pay | Admitting: Cardiology

## 2023-10-26 ENCOUNTER — Ambulatory Visit (INDEPENDENT_AMBULATORY_CARE_PROVIDER_SITE_OTHER): Payer: Medicare PPO

## 2023-10-26 VITALS — Ht 61.0 in | Wt 187.0 lb

## 2023-10-26 DIAGNOSIS — Z0000 Encounter for general adult medical examination without abnormal findings: Secondary | ICD-10-CM | POA: Diagnosis not present

## 2023-10-26 DIAGNOSIS — Z1211 Encounter for screening for malignant neoplasm of colon: Secondary | ICD-10-CM

## 2023-10-26 NOTE — Progress Notes (Signed)
 Subjective:   Kathy [name] is a 73 y.o. who presents for a Medicare Wellness preventive visit.  As a reminder, Annual Wellness Visits don't include a physical exam, and some assessments may be limited, especially if this visit is performed virtually. We may recommend an in-person follow-up visit with your provider if needed.  Visit Complete: Virtual I connected with  Kathy [name] on 10/26/23 by a audio enabled telemedicine application and verified that I am speaking with the correct person using two identifiers.  Patient Location: Home  Provider Location: Office/Clinic  I discussed the limitations of evaluation and management by telemedicine. The patient expressed understanding and agreed to proceed.  Vital Signs: Because this visit was a virtual/telehealth visit, some criteria may be missing or patient reported. Any vitals not documented were not able to be obtained and vitals that have been documented are patient reported.  VideoDeclined- This patient declined Librarian, academic. Therefore the visit was completed with audio only.  Persons Participating in Visit: Patient.  AWV Questionnaire: No: Patient Medicare AWV questionnaire was not completed prior to this visit.  Cardiac Risk Factors include: advanced age (>40men, >84 women);dyslipidemia;obesity (BMI >30kg/m2)     Objective:    Today's Vitals   10/26/23 0841  Weight: 187 lb (84.8 kg)  Height: 5' 1 (1.549 m)   Body mass index is 35.33 kg/m.     10/26/2023    8:49 AM 10/20/2022    9:30 AM 06/12/2022    4:19 PM 10/08/2021    9:41 AM 03/16/2021    4:01 PM  Advanced Directives  Does Patient Have a Medical Advance Directive? Yes Yes No Yes Yes  Type of Estate agent of [place];Living will Healthcare Power of [place];Living will  Healthcare Power of [place];Living will   Copy of Healthcare Power of Attorney in Chart? No - copy requested No - copy requested  No - copy  requested   Would patient like information on creating a medical advance directive?   No - Patient declined      Current Medications (verified) Outpatient Encounter Medications as of 10/26/2023  Medication Sig   ALPRAZolam  (XANAX ) 0.25 MG tablet Take 1 tablet (0.25 mg total) by mouth 2 (two) times daily as needed for anxiety (do not take for 8 hours after driving and do not take on nights you take ambien ).   apixaban  (ELIQUIS ) 5 MG TABS tablet Take 1 tablet (5 mg total) by mouth 2 (two) times daily.   Cholecalciferol 25 MCG (1000 UT) capsule Take 1,000 Units by mouth daily.   Krill Oil 1000 MG CAPS daily.   MAGNESIUM PO magnesium   metoprolol  succinate (TOPROL -XL) 25 MG 24 hr tablet Take 1 tablet (25 mg total) by mouth at bedtime.   rosuvastatin  (CRESTOR ) 20 MG tablet TAKE 1 TABLET(20 MG) BY MOUTH DAILY   vitamin B-12 (CYANOCOBALAMIN) 1000 MCG tablet Take 1,000 mcg by mouth daily.   zolpidem  (AMBIEN ) 10 MG tablet Take 0.5-1 tablets (5-10 mg total) by mouth at bedtime as needed for sleep (try to limit to 5 mg most of the time. do not drive 8 hours after taking).   azelastine  (ASTELIN ) 0.1 % nasal spray Place 2 sprays into both nostrils 2 (two) times daily. Use in each nostril as directed (Patient not taking: Reported on 10/26/2023)   No facility-administered encounter medications on file as of 10/26/2023.    Allergies (verified) Hydrochlorothiazide   History: Past Medical History:  Diagnosis Date   GERD (gastroesophageal  reflux disease) 07/02/2021   Sparing omeprazole . Was told hiatal hernia in past    History of heart murmur in childhood 07/02/2021   Hyperlipidemia, unspecified 07/02/2021   Not on meds    White coat syndrome without diagnosis of hypertension 07/02/2021   Diagnosed around age 69    Past Surgical History:  Procedure Laterality Date   CERVICAL SPINE SURGERY     CHOLECYSTECTOMY     COLONOSCOPY     Family History  Problem Relation Age of Onset   Atrial fibrillation Mother     Heart failure Mother    Hypertension Mother    Memory loss Mother    CAD Father        smoker died 62   Hyperlipidemia Father    Diabetes Father    Social History   Socioeconomic History   Marital status: Married    Spouse name: Not on file   Number of children: Not on file   Years of education: Not on file   Highest education level: Not on file  Occupational History   Not on file  Tobacco Use   Smoking status: Never   Smokeless tobacco: Never  Vaping Use   Vaping status: Never Used  Substance and Sexual Activity   Alcohol use: Yes    Comment: twice a month  or up to once a week   Drug use: Never   Sexual activity: Not on file  Other Topics Concern   Not on file  Social History Narrative   Married. No siblings. 2 kids. 2 grandkids 7 grandson, almost 4 daughter in 2023.       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, reading but harder when caring for grandkids.    Social Drivers of Corporate investment banker Strain: Low Risk  (10/26/2023)   Overall Financial Resource Strain (CARDIA)    Difficulty of Paying Living Expenses: Not hard at all  Food Insecurity: No Food Insecurity (10/26/2023)   Hunger Vital Sign    Worried About Running Out of Food in the Last Year: Never true    Ran Out of Food in the Last Year: Never true  Transportation Needs: No Transportation Needs (10/26/2023)   PRAPARE - Administrator, Civil Service (Medical): No    Lack of Transportation (Non-Medical): No  Physical Activity: Inactive (10/26/2023)   Exercise Vital Sign    Days of Exercise per Week: 0 days    Minutes of Exercise per Session: 0 min  Stress: No Stress Concern Present (10/26/2023)   [employer] of Occupational Health - Occupational Stress Questionnaire    Feeling of Stress: Not at all  Social Connections: Moderately Integrated (10/26/2023)   Social Connection and Isolation  Panel    Frequency of Communication with Friends and Family: More than three times a week    Frequency of Social Gatherings with Friends and Family: More than three times a week    Attends Religious Services: 1 to 4 times per year    Active Member of [employer] or Organizations: No    Attends Engineer, structural: Never    Marital Status: Married    Tobacco Counseling Counseling given: Not Answered    Clinical Intake:  Pre-visit preparation completed: Yes  Pain : 0-10 Pain Type: Chronic pain Pain Location: Hip Pain Orientation: Left Pain Descriptors / Indicators: Aching Pain Onset: More  than a month ago Pain Frequency: Intermittent     BMI - recorded: 35.33 Nutritional Status: BMI > 30  Obese Diabetes: No  Lab Results  Component Value Date   HGBA1C 5.9 02/02/2023     How often do you need to have someone help you when you read instructions, pamphlets, or other written materials from your doctor or pharmacy?: 1 - Never  Interpreter Needed?: No  Information entered by :: Ellouise Haws, LPN   Activities of Daily Living     10/26/2023    8:46 AM  In your present state of health, do you have any difficulty performing the following activities:  Hearing? 1  Comment hearing aids  Vision? 0  Difficulty concentrating or making decisions? 0  Walking or climbing stairs? 1  Comment at this time yes w hip pain  Dressing or bathing? 0  Doing errands, shopping? 0  Preparing Food and eating ? N  Using the Toilet? N  In the past six months, have you accidently leaked urine? Y  Comment wears a pad  Do you have problems with loss of bowel control? N  Managing your Medications? N  Managing your Finances? N  Housekeeping or managing your Housekeeping? N    Patient Care Team: Katrinka Garnette KIDD, MD as PCP - General (Family Medicine)  I have updated your Care Teams any recent Medical Services you may have received from other providers in the past year.      Assessment:   This is a routine wellness examination for Kathy [name].  Hearing/Vision screen Hearing Screening - Comments:: Pt has hearing aids  Vision Screening - Comments:: Wears rx glasses - up to date with routine eye exams with Dr Thom [facility]    Goals Addressed             This Visit's Progress    Patient Stated       Weight loss        Depression Screen     10/26/2023    8:48 AM 02/02/2023   11:00 AM 10/20/2022    9:28 AM 10/08/2021    9:36 AM 07/02/2021    1:02 PM  PHQ 2/9 Scores  PHQ - 2 Score 0 1 1 1 1   PHQ- 9 Score  6  2 4     Fall Risk     10/26/2023    8:50 AM 02/02/2023   11:00 AM 10/20/2022    8:58 AM 10/08/2021    9:42 AM  Fall Risk   Falls in the past year? 1 1 0 0  Number falls in past yr: 1 1 0 0  Injury with Fall? 1 0 0 0  Comment wrist injuury / tendonitits     Risk for fall due to : History of fall(s) History of fall(s) Impaired vision Impaired vision  Follow up Falls prevention discussed Falls evaluation completed Falls prevention discussed Falls prevention discussed      Data saved with a previous flowsheet row definition    MEDICARE RISK AT HOME:  Medicare Risk at Home Any stairs in or around the home?: Yes If so, are there any without handrails?: No Home free of loose throw rugs in walkways, pet beds, electrical cords, etc?: Yes Adequate lighting in your home to reduce risk of falls?: Yes Life alert?: No Use of a cane, walker or w/c?: No Grab bars in the bathroom?: No Shower chair or bench in shower?: Yes Elevated toilet seat or a handicapped toilet?: No  TIMED  UP AND GO:  Was the test performed?  No  Cognitive Function: 6CIT completed        10/26/2023    8:52 AM 10/20/2022    9:30 AM 10/08/2021    9:44 AM  6CIT Screen  What Year? 0 points 0 points 0 points  What month? 0 points 0 points 0 points  What time? 0 points 0 points 0 points  Count back from 20 0 points 0 points 0 points  Months in reverse 0 points 0  points 0 points  Repeat phrase 0 points 0 points 0 points  Total Score 0 points 0 points 0 points    Immunizations Immunization History  Administered Date(s) Administered   Fluad Quad(high Dose 65+) 02/08/2022   Fluad Trivalent(High Dose 65+) 02/02/2023   Fluzone Influenza virus vaccine,trivalent (IIV3), split virus 02/14/2009, 12/27/2018   Influenza Split 01/27/2011, 01/31/2014   PFIZER Comirnaty(Gray Top)Covid-19 Tri-Sucrose Vaccine 01/03/2022   PFIZER(Purple Top)SARS-COV-2 Vaccination 05/06/2019, 05/31/2019, 01/02/2020, 08/12/2020   Pfizer Covid-19 Vaccine Bivalent Booster 53yrs & up 12/21/2020   Pfizer(Comirnaty)Fall Seasonal Vaccine 12 years and older 01/29/2023   Pneumococcal Conjugate-13 07/16/2017   Pneumococcal Polysaccharide-23 10/05/2019   Tdap 01/27/2011, 11/21/2020   Zoster, Live 10/27/2012    Screening Tests Health Maintenance  Topic Date Due   Colonoscopy  Never done   Zoster Vaccines- Shingrix (1 of 2) 03/12/2002   COVID-19 Vaccine (8 - 2024-25 season) 07/29/2023   INFLUENZA VACCINE  10/30/2023   Medicare Annual Wellness (AWV)  10/25/2024   MAMMOGRAM  02/19/2025   DTaP/Tdap/Td (3 - Td or Tdap) 11/22/2030   Pneumococcal Vaccine: 50+ Years  Completed   DEXA SCAN  Completed   Hepatitis C Screening  Completed   Hepatitis B Vaccines  Aged Out   HPV VACCINES  Aged Out   Meningococcal B Vaccine  Aged Out    Health Maintenance  Health Maintenance Due  Topic Date Due   Colonoscopy  Never done   Zoster Vaccines- Shingrix (1 of 2) 03/12/2002   COVID-19 Vaccine (8 - 2024-25 season) 07/29/2023   Health Maintenance Items Addressed: See Nurse Notes at the end of this note  Additional Screening:  Vision Screening: Recommended annual ophthalmology exams for early detection of glaucoma and other disorders of the eye. Would you like a referral to an eye doctor? No    Dental Screening: Recommended annual dental exams for proper oral hygiene  Community Resource  Referral / Chronic Care Management: CRR required this visit?  No   CCM required this visit?  No   Plan:    I have personally reviewed and noted the following in the patient's chart:   Medical and social history Use of alcohol, tobacco or illicit drugs  Current medications and supplements including opioid prescriptions. Patient is not currently taking opioid prescriptions. Functional ability and status Nutritional status Physical activity Advanced directives List of other physicians Hospitalizations, surgeries, and ER visits in previous 12 months Vitals Screenings to include cognitive, depression, and falls Referrals and appointments  In addition, I have reviewed and discussed with patient certain preventive protocols, quality metrics, and best practice recommendations. A written personalized care plan for preventive services as well as general preventive health recommendations were provided to patient.   Ellouise VEAR Haws, LPN   [id number]   After Visit Summary: (MyChart) Due to this being a telephonic visit, the after visit summary with patients personalized plan was offered to patient via MyChart   Notes: Nothing significant to report at this time.

## 2023-10-26 NOTE — Patient Instructions (Signed)
 Ms. Miedema , Thank you for taking time out of your busy schedule to complete your Annual Wellness Visit with me. I enjoyed our conversation and look forward to speaking with you again next year. I, as well as your care team,  appreciate your ongoing commitment to your health goals. Please review the following plan we discussed and let me know if I can assist you in the future. Your Game plan/ To Do List    Referrals: If you haven't heard from the office you've been referred to, please reach out to them at the phone provided.   Follow up Visits: Next Medicare AWV with our clinical staff: 10/27/24   Have you seen your provider in the last 6 months (3 months if uncontrolled diabetes)? No Next Office Visit with your provider: 02/03/24  Clinician Recommendations:  Each day, aim for 6 glasses of water, plenty of protein in your diet and try to get up and walk/ stretch every hour for 5-10 minutes at a time.        This is a list of the screening recommended for you and due dates:  Health Maintenance  Topic Date Due   Colon Cancer Screening  Never done   Zoster (Shingles) Vaccine (1 of 2) 03/12/2002   COVID-19 Vaccine (8 - 2024-25 season) 07/29/2023   Medicare Annual Wellness Visit  10/20/2023   Flu Shot  10/30/2023   Mammogram  02/19/2025   DTaP/Tdap/Td vaccine (3 - Td or Tdap) 11/22/2030   Pneumococcal Vaccine for age over 71  Completed   DEXA scan (bone density measurement)  Completed   Hepatitis C Screening  Completed   Hepatitis B Vaccine  Aged Out   HPV Vaccine  Aged Out   Meningitis B Vaccine  Aged Out    Advanced directives: (Copy Requested) Please bring a copy of your health care power of attorney and living will to the office to be added to your chart at your convenience. You can mail to [facility] [address] [place], [place] [address] or email to [email or web address] Advance Care Planning is important because it:  [x]  Makes sure you receive the  medical care that is consistent with your values, goals, and preferences  [x]  It provides guidance to your family and loved ones and reduces their decisional burden about whether or not they are making the right decisions based on your wishes.  Follow the link provided in your after visit summary or read over the paperwork we have mailed to you to help you started getting your Advance Directives in place. If you need assistance in completing these, please reach out to us  so that we can help you!  See attachments for Preventive Care and Fall Prevention Tips.

## 2023-10-31 DIAGNOSIS — Z1211 Encounter for screening for malignant neoplasm of colon: Secondary | ICD-10-CM | POA: Diagnosis not present

## 2023-11-05 LAB — COLOGUARD: COLOGUARD: NEGATIVE

## 2023-11-09 ENCOUNTER — Ambulatory Visit: Payer: Self-pay | Admitting: Family Medicine

## 2023-11-17 DIAGNOSIS — H5213 Myopia, bilateral: Secondary | ICD-10-CM | POA: Diagnosis not present

## 2024-01-14 ENCOUNTER — Other Ambulatory Visit: Payer: Self-pay

## 2024-01-14 DIAGNOSIS — I4891 Unspecified atrial fibrillation: Secondary | ICD-10-CM

## 2024-01-14 MED ORDER — APIXABAN 5 MG PO TABS: 5.0000 mg | ORAL_TABLET | Freq: Two times a day (BID) | ORAL | 0 refills | Status: DC

## 2024-01-14 NOTE — Telephone Encounter (Signed)
 Prescription refill request for Eliquis  received. Indication: Last office visit:needs appt Scr: Age:  Weight:  Prescription refilled

## 2024-01-21 ENCOUNTER — Encounter: Payer: Self-pay | Admitting: Cardiology

## 2024-01-21 DIAGNOSIS — I4891 Unspecified atrial fibrillation: Secondary | ICD-10-CM

## 2024-01-22 MED ORDER — APIXABAN 5 MG PO TABS: 5.0000 mg | ORAL_TABLET | Freq: Two times a day (BID) | ORAL | 3 refills | Status: AC

## 2024-01-22 MED ORDER — ROSUVASTATIN CALCIUM 20 MG PO TABS: 20.0000 mg | ORAL_TABLET | Freq: Every day | ORAL | 3 refills | Status: AC

## 2024-01-22 MED ORDER — METOPROLOL SUCCINATE ER 25 MG PO TB24: 25.0000 mg | ORAL_TABLET | Freq: Every day | ORAL | 3 refills | Status: DC

## 2024-01-25 ENCOUNTER — Telehealth (HOSPITAL_COMMUNITY): Payer: Self-pay

## 2024-01-25 ENCOUNTER — Emergency Department (HOSPITAL_COMMUNITY)

## 2024-01-25 ENCOUNTER — Other Ambulatory Visit: Payer: Self-pay | Admitting: Family Medicine

## 2024-01-25 ENCOUNTER — Emergency Department (HOSPITAL_COMMUNITY)
Admission: EM | Admit: 2024-01-25 | Discharge: 2024-01-25 | Disposition: A | Discharge status: Home / Self Care | Attending: Emergency Medicine | Admitting: Emergency Medicine

## 2024-01-25 ENCOUNTER — Other Ambulatory Visit: Payer: Self-pay

## 2024-01-25 ENCOUNTER — Encounter (HOSPITAL_COMMUNITY): Payer: Self-pay

## 2024-01-25 DIAGNOSIS — Z7901 Long term (current) use of anticoagulants: Secondary | ICD-10-CM | POA: Diagnosis not present

## 2024-01-25 DIAGNOSIS — I4891 Unspecified atrial fibrillation: Secondary | ICD-10-CM | POA: Insufficient documentation

## 2024-01-25 DIAGNOSIS — Z1231 Encounter for screening mammogram for malignant neoplasm of breast: Secondary | ICD-10-CM

## 2024-01-25 DIAGNOSIS — R000 Tachycardia, unspecified: Secondary | ICD-10-CM | POA: Diagnosis present

## 2024-01-25 LAB — CBC
HCT: 46 % (ref 36.0–46.0)
Hemoglobin: 14.8 g/dL (ref 12.0–15.0)
MCH: 30.6 pg (ref 26.0–34.0)
MCHC: 32.2 g/dL (ref 30.0–36.0)
MCV: 95 fL (ref 80.0–100.0)
Platelets: 212 K/uL (ref 150–400)
RBC: 4.84 MIL/uL (ref 3.87–5.11)
RDW: 12.2 % (ref 11.5–15.5)
WBC: 6.7 K/uL (ref 4.0–10.5)
nRBC: 0 % (ref 0.0–0.2)

## 2024-01-25 LAB — BASIC METABOLIC PANEL WITH GFR
Anion gap: 11 (ref 5–15)
BUN: 15 mg/dL (ref 8–23)
CO2: 22 mmol/L (ref 22–32)
Calcium: 9.3 mg/dL (ref 8.9–10.3)
Chloride: 108 mmol/L (ref 98–111)
Creatinine, Ser: 0.81 mg/dL (ref 0.44–1.00)
GFR, Estimated: >60 mL/min (ref >60)
Glucose, Bld: 142 mg/dL — ABNORMAL HIGH (ref 70–99)
Potassium: 3.8 mmol/L (ref 3.5–5.1)
Sodium: 141 mmol/L (ref 135–145)

## 2024-01-25 LAB — TROPONIN I (HIGH SENSITIVITY): Troponin I (High Sensitivity): 6 ng/L (ref <18)

## 2024-01-25 MED ORDER — METOPROLOL TARTRATE 25 MG PO TABS
25.0000 mg | ORAL_TABLET | Freq: Once | ORAL | Status: AC
  Administered 2024-01-25: 25 mg via ORAL
  Filled 2024-01-25: qty 1

## 2024-01-25 NOTE — ED Triage Notes (Signed)
 Pt arrived from home via POV c/o heaviness in the chest 4/10 and palpitations that began about 0100 01/25/2024

## 2024-01-25 NOTE — Telephone Encounter (Signed)
 Patient called back. She has reached out to Dr. Pietro to see if he has a sooner appointment to discuss her A-fib.  She will contact the A-fib clinic back if she is not able to see Dr. Pietro within the next week or so. Consulted patient about the A-fib clinic and she verbalized understanding.

## 2024-01-25 NOTE — Telephone Encounter (Signed)
Left message for patient to call back to schedule ED follow up appointment. ?

## 2024-01-25 NOTE — ED Provider Notes (Signed)
 [name] EMERGENCY DEPARTMENT AT [facility] Provider Note   CSN: [id number] Arrival date & time: 01/25/24  9773     Patient presents with: Chest Pain and Palpitations   Kathy [name] is a 73 y.o. female.   Patient sent to emergency department with complaints of rapid and irregular heartbeat, concern for atrial fibrillation.  Patient has a history of paroxysmal atrial fibrillation, is chronically anticoagulated on Eliquis .  She was feeling well when she went to bed, was awakened by the symptoms.       Prior to Admission medications   Medication Sig Start Date End Date Taking? Authorizing Provider  ALPRAZolam  (XANAX ) 0.25 MG tablet Take 1 tablet (0.25 mg total) by mouth 2 (two) times daily as needed for anxiety (do not take for 8 hours after driving and do not take on nights you take ambien ). 02/02/23   Katrinka Garnette KIDD, MD  apixaban  (ELIQUIS ) 5 MG TABS tablet Take 1 tablet (5 mg total) by mouth 2 (two) times daily. 01/22/24   Pietro Redell RAMAN, MD  azelastine  (ASTELIN ) 0.1 % nasal spray Place 2 sprays into both nostrils 2 (two) times daily. Use in each nostril as directed Patient not taking: Reported on 10/26/2023 03/10/22   Allwardt, Mardy HERO, PA-C  Cholecalciferol 25 MCG (1000 UT) capsule Take 1,000 Units by mouth daily.    [provider]  Anselm Frizzle 1000 MG CAPS daily.    [provider]  MAGNESIUM PO magnesium    [provider]  metoprolol  succinate (TOPROL -XL) 25 MG 24 hr tablet Take 1 tablet (25 mg total) by mouth at bedtime. 01/22/24   Pietro Redell RAMAN, MD  rosuvastatin  (CRESTOR ) 20 MG tablet Take 1 tablet (20 mg total) by mouth daily. 01/22/24   Pietro Redell RAMAN, MD  vitamin B-12 (CYANOCOBALAMIN) 1000 MCG tablet Take 1,000 mcg by mouth daily.    [provider]  zolpidem  (AMBIEN ) 10 MG tablet Take 0.5-1 tablets (5-10 mg total) by mouth at bedtime as needed for sleep (try to limit to 5 mg most of the time. do not drive 8 hours  after taking). 11/22/22 10/26/23  Katrinka Garnette KIDD, MD    Allergies: Hydrochlorothiazide    Review of Systems  Updated Vital Signs BP 134/62   Pulse 74   Temp 98.5 F (36.9 C) (Oral)   Resp 13   Ht 5' 1 (1.549 m)   Wt 84.8 kg   SpO2 97%   BMI 35.32 kg/m   Physical Exam Vitals and nursing note reviewed.  Constitutional:      General: She is not in acute distress.    Appearance: She is well-developed.  HENT:     Head: Normocephalic and atraumatic.     Mouth/Throat:     Mouth: Mucous membranes are moist.  Eyes:     General: Vision grossly intact. Gaze aligned appropriately.     Extraocular Movements: Extraocular movements intact.     Conjunctiva/sclera: Conjunctivae normal.  Cardiovascular:     Rate and Rhythm: Tachycardia present. Rhythm irregular.     Pulses: Normal pulses.     Heart sounds: Normal heart sounds, S1 normal and S2 normal. No murmur heard.    No friction rub. No gallop.  Pulmonary:     Effort: Pulmonary effort is normal. No respiratory distress.     Breath sounds: Normal breath sounds.  Abdominal:     General: Bowel sounds are normal.     Palpations: Abdomen is soft.  Tenderness: There is no abdominal tenderness. There is no guarding or rebound.     Hernia: No hernia is present.  Musculoskeletal:        General: No swelling.     Cervical back: Full passive range of motion without pain, normal range of motion and neck supple. No spinous process tenderness or muscular tenderness. Normal range of motion.     Right lower leg: No edema.     Left lower leg: No edema.  Skin:    General: Skin is warm and dry.     Capillary Refill: Capillary refill takes less than 2 seconds.     Findings: No ecchymosis, erythema, rash or wound.  Neurological:     General: No focal deficit present.     Mental Status: She is alert and oriented to person, place, and time.     GCS: GCS eye subscore is 4. GCS verbal subscore is 5. GCS motor subscore is 6.     Cranial  Nerves: Cranial nerves 2-12 are intact.     Sensory: Sensation is intact.     Motor: Motor function is intact.     Coordination: Coordination is intact.  Psychiatric:        Attention and Perception: Attention normal.        Mood and Affect: Mood normal.        Speech: Speech normal.        Behavior: Behavior normal.     (all labs ordered are listed, but only abnormal results are displayed) Labs Reviewed  BASIC METABOLIC PANEL WITH GFR - Abnormal; Notable for the following components:      Result Value   Glucose, Bld 142 (*)    All other components within normal limits  CBC  TROPONIN I (HIGH SENSITIVITY)  TROPONIN I (HIGH SENSITIVITY)    EKG: EKG Interpretation Date/Time:  Monday January 25 2024 02:35:53 EDT Ventricular Rate:  171 PR Interval:    QRS Duration:  76 QT Interval:  276 QTC Calculation: 465 R Axis:   54  Text Interpretation: Atrial fibrillation with rapid ventricular response Marked ST abnormality, possible inferolateral subendocardial injury Abnormal ECG When compared with ECG of 12-Jun-2022 16:23, PREVIOUS ECG IS PRESENT Confirmed by Haze Lonni PARAS [phone]) on 01/25/2024 3:53:21 AM  Radiology: No results found.   Procedures   Medications Ordered in the ED  metoprolol  tartrate (LOPRESSOR ) tablet 25 mg (25 mg Oral Given 01/25/24 9687)                                    Medical Decision Making Amount and/or Complexity of Data Reviewed Labs: ordered.  Risk Prescription drug management.   To the emergency department for evaluation of rapid heartbeat.  Patient with known history of paroxysmal atrial fibrillation.  Patient in atrial fibrillation at arrival with a heart rate in the 170s.  While she was being placed in the room and hooked up to the monitor, patient spontaneously converted to sinus rhythm.  She has been monitored, no further arrhythmia.  She has follow-up scheduled with her cardiologist in the near future.  No further medication  changes will be needed.     Final diagnoses:  Atrial fibrillation with RVR [facility])    ED Discharge Orders     None          Haze Lonni PARAS, MD 01/25/24 [phone]

## 2024-02-03 ENCOUNTER — Encounter: Payer: Self-pay | Admitting: Family Medicine

## 2024-02-03 ENCOUNTER — Ambulatory Visit: Payer: Self-pay | Admitting: Family Medicine

## 2024-02-03 ENCOUNTER — Ambulatory Visit: Payer: Medicare PPO | Admitting: Family Medicine

## 2024-02-03 VITALS — BP 128/65 | HR 65 | Temp 98.2°F | Ht 61.0 in | Wt 197.8 lb

## 2024-02-03 DIAGNOSIS — Z0000 Encounter for general adult medical examination without abnormal findings: Secondary | ICD-10-CM | POA: Diagnosis not present

## 2024-02-03 DIAGNOSIS — E669 Obesity, unspecified: Secondary | ICD-10-CM | POA: Diagnosis not present

## 2024-02-03 DIAGNOSIS — Z23 Encounter for immunization: Secondary | ICD-10-CM

## 2024-02-03 DIAGNOSIS — Z131 Encounter for screening for diabetes mellitus: Secondary | ICD-10-CM

## 2024-02-03 DIAGNOSIS — E785 Hyperlipidemia, unspecified: Secondary | ICD-10-CM

## 2024-02-03 DIAGNOSIS — Z780 Asymptomatic menopausal state: Secondary | ICD-10-CM | POA: Diagnosis not present

## 2024-02-03 LAB — CBC WITH DIFFERENTIAL/PLATELET
Basophils Absolute: 0 K/uL (ref 0.0–0.1)
Basophils Relative: 0.4 % (ref 0.0–3.0)
Eosinophils Absolute: 0.1 K/uL (ref 0.0–0.7)
Eosinophils Relative: 2.6 % (ref 0.0–5.0)
HCT: 41.2 % (ref 36.0–46.0)
Hemoglobin: 13.8 g/dL (ref 12.0–15.0)
Lymphocytes Relative: 38.3 % (ref 12.0–46.0)
Lymphs Abs: 2.1 K/uL (ref 0.7–4.0)
MCHC: 33.6 g/dL (ref 30.0–36.0)
MCV: 91.4 fl (ref 78.0–100.0)
Monocytes Absolute: 0.5 K/uL (ref 0.1–1.0)
Monocytes Relative: 9.8 % (ref 3.0–12.0)
Neutro Abs: 2.7 K/uL (ref 1.4–7.7)
Neutrophils Relative %: 48.9 % (ref 43.0–77.0)
Platelets: 226 K/uL (ref 150.0–400.0)
RBC: 4.51 Mil/uL (ref 3.87–5.11)
RDW: 12.5 % (ref 11.5–15.5)
WBC: 5.5 K/uL (ref 4.0–10.5)

## 2024-02-03 LAB — COMPREHENSIVE METABOLIC PANEL WITH GFR
ALT: 27 U/L (ref 0–35)
AST: 27 U/L (ref 0–37)
Albumin: 4.5 g/dL (ref 3.5–5.2)
Alkaline Phosphatase: 68 U/L (ref 39–117)
BUN: 16 mg/dL (ref 6–23)
CO2: 28 meq/L (ref 19–32)
Calcium: 9.2 mg/dL (ref 8.4–10.5)
Chloride: 105 meq/L (ref 96–112)
Creatinine, Ser: 0.67 mg/dL (ref 0.40–1.20)
GFR: 87.64 mL/min (ref >60.00)
Glucose, Bld: 120 mg/dL — ABNORMAL HIGH (ref 70–99)
Potassium: 4.1 meq/L (ref 3.5–5.1)
Sodium: 139 meq/L (ref 135–145)
Total Bilirubin: 0.6 mg/dL (ref 0.2–1.2)
Total Protein: 7.1 g/dL (ref 6.0–8.3)

## 2024-02-03 LAB — HEMOGLOBIN A1C: Hgb A1c MFr Bld: 6.4 % (ref 4.6–6.5)

## 2024-02-03 LAB — LIPID PANEL
Cholesterol: 119 mg/dL (ref 0–200)
HDL: 46.9 mg/dL (ref >39.00)
LDL Cholesterol: 47 mg/dL (ref 0–99)
NonHDL: 72.58
Total CHOL/HDL Ratio: 3
Triglycerides: 127 mg/dL (ref 0.0–149.0)
VLDL: 25.4 mg/dL (ref 0.0–40.0)

## 2024-02-03 NOTE — Patient Instructions (Addendum)
 I suggest myfitnesspal Use 0.5 pounds per week weight loss goal (or up to 1 pounds if needed) Set a reasonable goal such as 5-10 lbs and can reset goal once you reach the goal Do not connect your step counter to this- watch or phone Update me in 2-3 months with how you are doing  Schedule your bone density test at check out desk.  - located [address] across the street from [place] - in the basement - you DO NEED an appointment for the bone density tests.   Please stop by lab before you go If you have mychart- we will send your results within 3 business days of us  receiving them.  If you do not have mychart- we will call you about results within 5 business days of us  receiving them.  *please also note that you will see labs on mychart as soon as they post. I will later go in and write notes on them- will say notes from Dr. Katrinka   Recommended follow up: Return in about 1 year (around 02/02/2025) for physical or sooner if needed.Schedule b4 you leave.

## 2024-02-03 NOTE — Progress Notes (Signed)
 Phone [phone]   Subjective:  Patient presents today for their annual physical. Chief complaint-noted.   See problem oriented charting- ROS- full  review of systems was completed and negative except for topics noted under acute/chronic concerns  The following were reviewed and entered/updated in epic: Past Medical History:  Diagnosis Date   GERD (gastroesophageal reflux disease) 07/02/2021   Sparing omeprazole . Was told hiatal hernia in past    History of heart murmur in childhood 07/02/2021   Hyperlipidemia, unspecified 07/02/2021   Not on meds    White coat syndrome without diagnosis of hypertension 07/02/2021   Diagnosed around age 20    Patient Active Problem List   Diagnosis Date Noted   A-fib [facility]) 07/30/2022    Priority: High   Insomnia 03/10/2022    Priority: Medium    Hyperlipidemia, unspecified 07/02/2021    Priority: Medium    Anxiety 11/29/2017    Priority: Medium    Caregiver burden 07/02/2021    Priority: Low   White coat syndrome without diagnosis of hypertension 07/02/2021    Priority: Low   GERD (gastroesophageal reflux disease) 07/02/2021    Priority: Low   History of heart murmur in childhood 07/02/2021    Priority: Low   Obesity (BMI 30-39.9) 02/03/2024   Past Surgical History:  Procedure Laterality Date   CERVICAL SPINE SURGERY     CHOLECYSTECTOMY     COLONOSCOPY      Family History  Problem Relation Age of Onset   Atrial fibrillation Mother    Heart failure Mother    Hypertension Mother    Memory loss Mother    CAD Father        smoker died 8   Hyperlipidemia Father    Diabetes Father     Medications- reviewed and updated Current Outpatient Medications  Medication Sig Dispense Refill   ALPRAZolam  (XANAX ) 0.25 MG tablet Take 1 tablet (0.25 mg total) by mouth 2 (two) times daily as needed for anxiety (do not take for 8 hours after driving and do not take on nights you take ambien ). 20 tablet 0   apixaban  (ELIQUIS ) 5 MG TABS tablet  Take 1 tablet (5 mg total) by mouth 2 (two) times daily. 180 tablet 3   Cholecalciferol 25 MCG (1000 UT) capsule Take 1,000 Units by mouth daily.     Krill Oil 1000 MG CAPS daily.     MAGNESIUM PO magnesium     metoprolol  succinate (TOPROL -XL) 25 MG 24 hr tablet Take 1 tablet (25 mg total) by mouth at bedtime. 90 tablet 3   rosuvastatin  (CRESTOR ) 20 MG tablet Take 1 tablet (20 mg total) by mouth daily. 90 tablet 3   vitamin B-12 (CYANOCOBALAMIN) 1000 MCG tablet Take 1,000 mcg by mouth daily.     zolpidem  (AMBIEN ) 10 MG tablet Take 0.5-1 tablets (5-10 mg total) by mouth at bedtime as needed for sleep (try to limit to 5 mg most of the time. do not drive 8 hours after taking). 20 tablet 1   azelastine  (ASTELIN ) 0.1 % nasal spray Place 2 sprays into both nostrils 2 (two) times daily. Use in each nostril as directed (Patient not taking: Reported on 02/03/2024) 30 mL 1   No current facility-administered medications for this visit.    Allergies-reviewed and updated Allergies  Allergen Reactions   Hydrochlorothiazide     Other reaction(s): low potassium    Social History   Social History Narrative   Married. No siblings. 2 kids. 2 grandkids 7 grandson,  almost 4 daughter in 2023.       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, reading but harder when caring for grandkids.    Objective  Objective:  BP 128/65 Comment: most recent home reading  Pulse 65   Temp 98.2 F (36.8 C) (Temporal)   Ht 5' 1 (1.549 m)   Wt 197 lb 12.8 oz (89.7 kg)   SpO2 94%   BMI 37.37 kg/m  Gen: NAD, resting comfortably HEENT: Mucous membranes are moist. Oropharynx normal Neck: no thyromegaly CV: RRR no murmurs rubs or gallops Lungs: CTAB no crackles, wheeze, rhonchi Abdomen: soft/nontender/nondistended/normal bowel sounds. No rebound or guarding.  Ext: no edema Skin: warm, dry Neuro: grossly normal, moves all  extremities, PERRLA   Assessment and Plan   73 y.o. female presenting for annual physical.  Health Maintenance counseling: 1. Anticipatory guidance: Patient counseled regarding regular dental exams -q6 months, eye exams - yearly,  avoiding smoking and second hand smoke , limiting alcohol to 1 beverage per day-primarily social such as at the beach , no illicit drugs .   2. Risk factor reduction:  Advised patient of need for regular exercise and diet rich and fruits and vegetables to reduce risk of heart attack and stroke.  Exercise- in process of settling after moving- and walking has not gotten back up though is still active- gets her circles on her watch. Plantar fasciitis comes and goes. Various orthopedic issues get in way as way  Diet/weight management-unfortunately up 10 pounds in the last year-prior felt stuck on weight watchers- doesn't want to return- could try myfitnesspal.  Wt Readings from Last 3 Encounters:  02/03/24 197 lb 12.8 oz (89.7 kg)  01/25/24 186 lb 15.2 oz (84.8 kg)  10/26/23 187 lb (84.8 kg)  3. Immunizations/screenings/ancillary studies-consider Shingrix at pharmacy, otherwise she is up-to-date  Immunization History  Administered Date(s) Administered   Fluad Quad(high Dose 65+) 02/08/2022   Fluad Trivalent(High Dose 65+) 02/02/2023   Fluzone Influenza virus vaccine,trivalent (IIV3), split virus 02/14/2009, 12/27/2018   INFLUENZA, HIGH DOSE SEASONAL PF 02/03/2024   Influenza Split 01/27/2011, 01/31/2014   PFIZER Comirnaty(Gray Top)Covid-19 Tri-Sucrose Vaccine 01/03/2022   PFIZER(Purple Top)SARS-COV-2 Vaccination 05/06/2019, 05/31/2019, 01/02/2020, 08/12/2020   Pfizer Covid-19 Vaccine Bivalent Booster 66yrs & up 12/21/2020   Pfizer(Comirnaty)Fall Seasonal Vaccine 12 years and older 01/29/2023   Pneumococcal Conjugate-13 07/16/2017   Pneumococcal Polysaccharide-23 10/05/2019   Tdap 01/27/2011, 11/21/2020   Unspecified SARS-COV-2 Vaccination 01/20/2024   Zoster, Live  10/27/2012   4. Cervical cancer screening- her GYN retired. Past age based screening recommendations-no history of abnormal exams or recent discharge or blood noted 5. Breast cancer screening-  breast exam-self exams-and mammogram February 20, 2023-not yet due- but does yearly 6. Colon cancer screening - August 2025-Cologard is negative.  Repeat screening in 3 years 7. Skin cancer screening-sees Dr. Shona. advised regular sunscreen use. Denies worrisome, changing, or new skin lesions.  8. Birth control/STD check-postmenopausal only active with husband 9. Osteoporosis screening at 65-2019 normal-offered repeat and wants to hold off for now  10. Smoking associated screening -never smoker  Status of chronic or acute concerns   # Atrial fibrillation-sees Dr. Pietro S: Rate controlled with metoprolol  25 mg extended release Anticoagulated with Eliquis  5 mg twice daily  -did have RVR scare - treated and doing better- usually spaces 18 months A/P: appropriately anticoagulated and rate controlled- continue current medicine    #  hyperlipidemia- sees Dr. pietro RAMAN: Medication: rosuvastatin  20 mg daily, Krill oil, no aspirin on eliquis  -Father did have CAD age 42 Lab Results  Component Value Date   CHOL 148 10/07/2022   HDL 57 10/07/2022   LDLCALC 69 10/07/2022   TRIG 127 10/07/2022   CHOLHDL 2.6 10/07/2022   A/P:  lipids at goal- hopefully stable- continue current medications and update today  # Anxiety/insomnia S: Medication: Ambien  10 mg prescribed but if recommended 5 mg max (aware of potential memory loss/dementia risks and fall risk), alprazolam  0.25 mg sparingly for anxiety perhaps twice a week -Stressors include caregiving for mom -We attempted to add therapy in 2024- not needing at present  - therapy sessions were helpful and may restart  Feels doing better lately A/P: anxiety/insomnia reaosnably controlled- continue current medications    # Whitecoat elevated blood pressure--  blood pressure good at home lately  - last summer was higher at times with alcohol but has come back down  Recommended follow up: Return in about 1 year (around 02/02/2025) for physical or sooner if needed.Schedule b4 you leave. Future Appointments  Date Time Provider Department Center  02/22/2024  9:40 AM [facility] MM 3 [facility]  03/09/2024  8:20 AM Crenshaw, Redell RAMAN, MD [facility] None  10/27/2024  9:20 AM [facility] ANNUAL WELLNESS VISIT 1 [facility] [place]   Lab/Order associations: fasting   ICD-10-CM   1. Preventative health care  Z00.00     2. Immunization due  Z23 Flu vaccine HIGH DOSE PF(Fluzone Trivalent)    3. Postmenopausal  Z78.0 DG Bone Density    4. Hyperlipidemia, unspecified hyperlipidemia type  E78.5 CBC with Differential/Platelet    Comprehensive metabolic panel with GFR    Lipid panel    5. Screening for diabetes mellitus  Z13.1 Hemoglobin A1c    6. Obesity (BMI 30-39.9)  E66.9 Hemoglobin A1c    Amb Ref to Medical Weight Management      No orders of the defined types were placed in this encounter.   Return precautions advised.  Garnette Lukes, MD

## 2024-02-04 ENCOUNTER — Encounter (INDEPENDENT_AMBULATORY_CARE_PROVIDER_SITE_OTHER): Payer: Self-pay

## 2024-02-11 ENCOUNTER — Encounter

## 2024-02-11 DIAGNOSIS — Z1231 Encounter for screening mammogram for malignant neoplasm of breast: Secondary | ICD-10-CM

## 2024-02-22 ENCOUNTER — Ambulatory Visit
Admission: RE | Admit: 2024-02-22 | Discharge: 2024-02-22 | Disposition: A | Discharge status: Home / Self Care | Source: Ambulatory Visit

## 2024-02-22 DIAGNOSIS — Z1231 Encounter for screening mammogram for malignant neoplasm of breast: Secondary | ICD-10-CM | POA: Diagnosis not present

## 2024-02-29 NOTE — Progress Notes (Unsigned)
 HPI: FU atrial fibrillation.  Calcium  score November 2023 310 which was 84th percentile.  Patient seen with palpitations March 2024 in the emergency room.  She states that her Apple Watch told her that she may be in atrial fibrillation.  Echocardiogram May 2024 showed normal LV function, grade 1 diastolic dysfunction, mild left atrial enlargement, mild aortic insufficiency.  Seen in the emergency room October 2025 with recurrent atrial fibrillation.  She converted spontaneously to sinus rhythm.  Since last seen   Current Outpatient Medications  Medication Sig Dispense Refill   ALPRAZolam  (XANAX ) 0.25 MG tablet Take 1 tablet (0.25 mg total) by mouth 2 (two) times daily as needed for anxiety (do not take for 8 hours after driving and do not take on nights you take ambien ). 20 tablet 0   apixaban  (ELIQUIS ) 5 MG TABS tablet Take 1 tablet (5 mg total) by mouth 2 (two) times daily. 180 tablet 3   azelastine  (ASTELIN ) 0.1 % nasal spray Place 2 sprays into both nostrils 2 (two) times daily. Use in each nostril as directed (Patient not taking: Reported on 02/03/2024) 30 mL 1   Cholecalciferol 25 MCG (1000 UT) capsule Take 1,000 Units by mouth daily.     Krill Oil 1000 MG CAPS daily.     MAGNESIUM PO magnesium     metoprolol  succinate (TOPROL -XL) 25 MG 24 hr tablet Take 1 tablet (25 mg total) by mouth at bedtime. 90 tablet 3   rosuvastatin  (CRESTOR ) 20 MG tablet Take 1 tablet (20 mg total) by mouth daily. 90 tablet 3   vitamin B-12 (CYANOCOBALAMIN) 1000 MCG tablet Take 1,000 mcg by mouth daily.     zolpidem  (AMBIEN ) 10 MG tablet Take 0.5-1 tablets (5-10 mg total) by mouth at bedtime as needed for sleep (try to limit to 5 mg most of the time. do not drive 8 hours after taking). 20 tablet 1   No current facility-administered medications for this visit.     Past Medical History:  Diagnosis Date   GERD (gastroesophageal reflux disease) 07/02/2021   Sparing omeprazole . 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 07/02/2021   Diagnosed around age 89     Past Surgical History:  Procedure Laterality Date   CERVICAL SPINE SURGERY     CHOLECYSTECTOMY     COLONOSCOPY      Social History   Socioeconomic History   Marital status: Married    Spouse name: Not on file   Number of children: Not on file   Years of education: Not on file   Highest education level: Not on file  Occupational History   Not on file  Tobacco Use   Smoking status: Never   Smokeless tobacco: Never  Vaping Use   Vaping status: Never Used  Substance and Sexual Activity   Alcohol use: Yes    Comment: twice a month  or up to once a week   Drug use: Never   Sexual activity: Not on file  Other Topics Concern   Not on file  Social History Narrative   Married. No siblings. 2 kids. 2 grandkids 7 grandson, almost 4 daughter in 2023.       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    Social Drivers of Corporate Investment Banker Strain: Low Risk  (10/26/2023)   Overall Financial Resource Strain (CARDIA)    Difficulty of Paying Living Expenses: Not hard at all  Food Insecurity: No Food Insecurity (10/26/2023)   Hunger Vital Sign    Worried About Running Out of Food in the Last Year: Never true    Ran Out of Food in the Last Year: Never true  Transportation Needs: No Transportation Needs (10/26/2023)   PRAPARE - Administrator, Civil Service (Medical): No    Lack of Transportation (Non-Medical): No  Physical Activity: Inactive (10/26/2023)   Exercise Vital Sign    Days of Exercise per Week: 0 days    Minutes of Exercise per Session: 0 min  Stress: No Stress Concern Present (10/26/2023)   [employer] of Occupational Health  - Occupational Stress Questionnaire    Feeling of Stress: Not at all  Social Connections: Moderately Integrated (10/26/2023)   Social Connection and Isolation Panel    Frequency of Communication with Friends and Family: More than three times a week    Frequency of Social Gatherings with Friends and Family: More than three times a week    Attends Religious Services: 1 to 4 times per year    Active Member of [employer] or Organizations: No    Attends Banker Meetings: Never    Marital Status: Married  Catering Manager Violence: Not At Risk (10/26/2023)   Humiliation, Afraid, Rape, and Kick questionnaire    Fear of Current or Ex-Partner: No    Emotionally Abused: No    Physically Abused: No    Sexually Abused: No    Family History  Problem Relation Age of Onset   Atrial fibrillation Mother    Heart failure Mother    Hypertension Mother    Memory loss Mother    CAD Father        smoker died 69   Hyperlipidemia Father    Diabetes Father     ROS: no fevers or chills, productive cough, hemoptysis, dysphasia, odynophagia, melena, hematochezia, dysuria, hematuria, rash, seizure activity, orthopnea, PND, pedal edema, claudication. Remaining systems are negative.  Physical Exam: Well-developed well-nourished in no acute distress.  Skin is warm and dry.  HEENT is normal.  Neck is supple.  Chest is clear to auscultation with normal expansion.  Cardiovascular exam is regular rate and rhythm.  Abdominal exam nontender or distended. No masses palpated. Extremities show no edema. neuro grossly intact  ECG- personally reviewed  A/P  1 paroxysmal atrial fibrillation-patient is in sinus rhythm today.  Continue apixaban  and Toprol .  2 coronary calcification-continue statin.  3 hyperlipidemia-continue statin.  Redell Shallow, MD

## 2024-03-09 ENCOUNTER — Encounter: Payer: Self-pay | Admitting: Cardiology

## 2024-03-09 ENCOUNTER — Ambulatory Visit: Attending: Cardiology | Admitting: Cardiology

## 2024-03-09 VITALS — BP 150/75 | HR 73 | Ht 61.0 in | Wt 197.1 lb

## 2024-03-09 DIAGNOSIS — E785 Hyperlipidemia, unspecified: Secondary | ICD-10-CM

## 2024-03-09 DIAGNOSIS — I2510 Atherosclerotic heart disease of native coronary artery without angina pectoris: Secondary | ICD-10-CM | POA: Diagnosis not present

## 2024-03-09 DIAGNOSIS — I480 Paroxysmal atrial fibrillation: Secondary | ICD-10-CM | POA: Diagnosis not present

## 2024-03-09 MED ORDER — METOPROLOL SUCCINATE ER 50 MG PO TB24: 50.0000 mg | ORAL_TABLET | Freq: Every day | ORAL | 3 refills | Status: AC

## 2024-03-09 NOTE — Patient Instructions (Signed)
 Medication Instructions:   INCREASE METOPROLOL  TO 50 MG ONCE DAILY= 2 OF THE 25 MG TABLETS ONCE DAILY  *If you need a refill on your cardiac medications before your next appointment, please call your pharmacy*  Follow-Up: At [facility], you and your health needs are our priority.  As part of our continuing mission to provide you with exceptional heart care, our providers are all part of one team.  This team includes your primary Cardiologist (physician) and Advanced Practice Providers or APPs (Physician Assistants and Nurse Practitioners) who all work together to provide you with the care you need, when you need it.  Your next appointment:   6 month(s)  Provider:   Redell Shallow, MD

## 2024-05-02 DIAGNOSIS — Z0289 Encounter for other administrative examinations: Secondary | ICD-10-CM

## 2024-05-03 ENCOUNTER — Ambulatory Visit (INDEPENDENT_AMBULATORY_CARE_PROVIDER_SITE_OTHER): Admitting: Family Medicine

## 2024-05-03 ENCOUNTER — Encounter (INDEPENDENT_AMBULATORY_CARE_PROVIDER_SITE_OTHER): Payer: Self-pay | Admitting: Family Medicine

## 2024-05-03 ENCOUNTER — Telehealth (INDEPENDENT_AMBULATORY_CARE_PROVIDER_SITE_OTHER): Payer: Self-pay | Admitting: Family Medicine

## 2024-05-03 VITALS — BP 150/70 | HR 73 | Temp 97.6°F | Ht 61.0 in | Wt 198.0 lb

## 2024-05-03 DIAGNOSIS — E785 Hyperlipidemia, unspecified: Secondary | ICD-10-CM

## 2024-05-03 DIAGNOSIS — E669 Obesity, unspecified: Secondary | ICD-10-CM

## 2024-05-03 DIAGNOSIS — I4891 Unspecified atrial fibrillation: Secondary | ICD-10-CM

## 2024-05-03 DIAGNOSIS — Z636 Dependent relative needing care at home: Secondary | ICD-10-CM

## 2024-05-03 NOTE — Telephone Encounter (Signed)
 New Patient Script Check List Patient Acknowledges Each Item Below:  [x]   There is a one-time $99.00 Program Fee.  [x]   You must have a Body Mass Index (BMI) of 30 or higher to qualify for our program.  [x]   Once you become a New Patient and schedule your first appointment, you are responsible for completing our New Patient Packet.  [x]   Prior to the first appointment, you will be required to fast for eight hours  [x]   You must also drink plenty of water that day before and morning of your new patient appointment.  [x]   You will also have an electrocardiogram (EKG) during your New Patient visit  do not apply any lotions or creams prior to the appointment.   [x]   You will also have a metabolic breathing test known as the Indirect Calorimetry (IC) Test  [x]   We are a Specialty office, but we bill as a Primary Care. You will have the same co-payments and co-insurances as you would at your primary care provider's office.  [x]   We have a 5-minute grace period. If you arrive later than this time, we will need to reschedule your appointment to a later time or date.  [x]   There is a $50 fee if the initial consultation visit, New Patient Appointment, or First Follow-up Appointment is a no-show, so please make sure to pick a time that works with your schedule.   [x]   We recommend that all our patients sign up for and utilize [facility] [employer].

## 2024-05-23 ENCOUNTER — Ambulatory Visit (INDEPENDENT_AMBULATORY_CARE_PROVIDER_SITE_OTHER): Admitting: Family Medicine

## 2024-06-06 ENCOUNTER — Ambulatory Visit (INDEPENDENT_AMBULATORY_CARE_PROVIDER_SITE_OTHER): Admitting: Family Medicine

## 2024-10-27 ENCOUNTER — Ambulatory Visit

## 2025-02-06 ENCOUNTER — Encounter: Admitting: Family Medicine
